# Patient Record
Sex: Female | Born: 1965 | Race: White | Hispanic: No | Marital: Married | Smoking: Never smoker
Health system: Southern US, Community
[De-identification: ages and names within clinical notes are randomized; demographics above are authoritative.]

## PROBLEM LIST (undated history)

## (undated) HISTORY — PX: TUBAL LIGATION: SHX77

## (undated) HISTORY — PX: TONSILLECTOMY: SUR1361

---

## 2005-01-20 ENCOUNTER — Emergency Department: Payer: Self-pay | Admitting: Unknown Physician Specialty

## 2005-01-20 ENCOUNTER — Inpatient Hospital Stay: Payer: Self-pay | Admitting: Internal Medicine

## 2005-07-29 ENCOUNTER — Ambulatory Visit: Payer: Self-pay | Admitting: Obstetrics and Gynecology

## 2005-10-05 ENCOUNTER — Ambulatory Visit: Payer: Self-pay

## 2007-10-09 ENCOUNTER — Ambulatory Visit: Payer: Self-pay

## 2009-12-30 ENCOUNTER — Ambulatory Visit: Payer: Self-pay | Admitting: Family Medicine

## 2011-06-14 HISTORY — PX: TOTAL ABDOMINAL HYSTERECTOMY: SHX209

## 2011-06-14 HISTORY — PX: ABDOMINAL HYSTERECTOMY: SHX81

## 2011-08-09 LAB — LIPID PANEL
Cholesterol: 124 mg/dL (ref 0–200)
HDL: 60 mg/dL (ref 35–70)
LDL CALC: 57 mg/dL
Triglycerides: 36 mg/dL — AB (ref 40–160)

## 2011-08-10 ENCOUNTER — Ambulatory Visit: Payer: Self-pay | Admitting: Internal Medicine

## 2011-08-12 ENCOUNTER — Ambulatory Visit: Payer: Self-pay | Admitting: Internal Medicine

## 2011-12-20 ENCOUNTER — Ambulatory Visit: Payer: Self-pay | Admitting: Obstetrics and Gynecology

## 2011-12-20 LAB — BASIC METABOLIC PANEL
BUN: 18 mg/dL (ref 7–18)
EGFR (Non-African Amer.): >60
Glucose: 87 mg/dL (ref 65–99)
Osmolality: 281 (ref 275–301)
Potassium: 3.5 mmol/L (ref 3.5–5.1)
Sodium: 140 mmol/L (ref 136–145)

## 2011-12-26 ENCOUNTER — Ambulatory Visit: Payer: Self-pay | Admitting: Obstetrics and Gynecology

## 2011-12-27 LAB — HEMATOCRIT: HCT: 33.7 % — ABNORMAL LOW (ref 35.0–47.0)

## 2011-12-27 LAB — BASIC METABOLIC PANEL
Anion Gap: 7 (ref 7–16)
BUN: 10 mg/dL (ref 7–18)
Chloride: 103 mmol/L (ref 98–107)
Creatinine: 0.58 mg/dL — ABNORMAL LOW (ref 0.60–1.30)
EGFR (Non-African Amer.): >60
Glucose: 103 mg/dL — ABNORMAL HIGH (ref 65–99)
Potassium: 3.8 mmol/L (ref 3.5–5.1)
Sodium: 138 mmol/L (ref 136–145)

## 2011-12-27 LAB — PATHOLOGY REPORT

## 2012-02-09 ENCOUNTER — Ambulatory Visit: Payer: Self-pay | Admitting: Internal Medicine

## 2013-02-18 LAB — TSH: TSH: 1.1 u[IU]/mL (ref <=5.90)

## 2013-02-18 LAB — BASIC METABOLIC PANEL
BUN: 20 mg/dL (ref 4–21)
Creatinine: 0.6 mg/dL (ref <=1.1)

## 2013-02-18 LAB — CBC AND DIFFERENTIAL
Hemoglobin: 12.5 g/dL (ref 12.0–16.0)
WBC: 4.9 10*3/mL

## 2013-02-21 ENCOUNTER — Ambulatory Visit: Payer: Self-pay | Admitting: Internal Medicine

## 2013-02-21 LAB — HM MAMMOGRAPHY: HM Mammogram: NORMAL

## 2014-09-30 NOTE — Discharge Summary (Signed)
PATIENT NAMEJAILYNE, [name] MR#:  [id number] DATE OF BIRTH:  21-Feb-1966  DATE OF ADMISSION:  12/26/2011 DATE OF DISCHARGE:  12/27/2011  PRINCIPLE PROCEDURE:  1. Total vaginal hysterectomy. 2. Anterior colporrhaphy.   HOSPITAL COURSE: The patient underwent the above procedures which were uncomplicated. The patient on postoperative day number one was tolerating clear liquids without difficulty and urinating after the Foley catheter was removed. On postoperative day number hematocrit 33.7% and BUN and creatinine 10 and 0.58. Exam is benign. Minimal spotting. The patient is discharged to home in good condition.   DISCHARGE MEDICATIONS:  1. Norco 1 to 2 tablets every 4 to 6 hours as needed.  2. Naprosyn 500 mg 1 tablet every 12 hours as needed.   DISCHARGE INSTRUCTIONS: The patient will follow-up with Dr. Ouida Sills in two weeks for wound care. Pelvic rest is to be followed for the next four weeks. The patient will follow up before two weeks if she has increasing nausea or vomiting, increasing heavy vaginal bleeding, or fever.  ____________________________ Boykin Nearing, MD tjs:slb D: 12/27/2011 08:38:06 ET T: 12/27/2011 15:01:54 ET JOB#: [id number]  cc: Boykin Nearing, MD, <Dictator> Boykin Nearing MD ELECTRONICALLY SIGNED 01/02/2012 9:17

## 2014-10-05 NOTE — Op Note (Signed)
PATIENT NAMEFRANCENA, [name] MR#:  [id number] DATE OF BIRTH:  Jun 30, 1965  DATE OF PROCEDURE:  12/26/2011  PREOPERATIVE DIAGNOSES:  1. Third degree uterine descensus. 2. Second degree cystocele.   POSTOPERATIVE DIAGNOSES: 1. Third degree uterine descensus. 2. Second degree cystocele.   PROCEDURES:  1. Total vaginal hysterectomy.  2. Anterior colporrhaphy.   ANESTHESIA: General endotracheal.   SURGEON: Boykin Nearing, MD   FIRST ASSISTANT: Franchot Erichsen, MD   INDICATIONS: This is a 49 year old gravida 3 para 3 patient with bulging tissues at the vagina. The patient is noted to have a third degree uterine descensus and a second degree cystocele.   PROCEDURE: After adequate general endotracheal anesthesia, the patient was placed in the dorsal supine position with the legs in the candy-cane stirrups. The patient received 2 grams IV cefoxitin prior to commencement of the case. Lower abdomen, perineal, and vagina were prepped and draped in a normal sterile fashion. The patient's bladder was catheterized yielding 600 mL of urine. Protruding cervix noted at the introitus. A weighted speculum was placed in the posterior vaginal vault and the anterior and posterior cervix were grasped with thyroid tenacula and the cervix was circumferentially injected with 1% lidocaine with 1:100,000 epinephrine. Direct posterior colpotomy incision was made. Upon entry into the posterior cul-de-sac, the uterosacral ligaments were bilaterally clamped, transected, suture ligated, and tagged for later identification. The anterior cervix was then circumferentially incised with the Bovie cautery and anterior cul-de-sac entered without difficulty. There was noted to have some scar tissue at the fundal area. The Deaver retractor was placed within this blind cul-de-sac to elevate the bladder while dissection ensued. The uterine arteries were bilaterally clamped, transected, and suture ligated with 0 Vicryl suture. Serial  clamps in the broad ligament performed, transected, and suture ligated with 0 Vicryl suture. The uterus was then flipped posteriorly and the cornua were then identified and clamped ensuring not to incorporate the bladder. The uterus was then removed and the corneal pedicles were doubly ligated with 0 Vicryl suture. Good hemostasis noted. Ovaries appeared normal. The peritoneum was then closed with a purse-string 2-0 PDS suture with good closure. Attention was then directed to the patient's anterior vagina. The anterior vagina was grasped with two Allis clamps and the epithelium was injected with 1% lidocaine with 1:100,000 epinephrine. The anterior vagina was then opened centrally to approximately 2 cm from the urethral orifice. The vaginal epithelium was then opened in its full length and the subfascial tissues dissected off the epithelium bilaterally. Good hemostasis was noted. The reduction of the tissue was accomplished with sharp dissection and gentle traction with a gauze draped finger during the dissection. Healthy pelvic fascia was then grasped laterally and plicated centrally. Several 2-0 Vicryl sutures were used to repair the defect. Good cosmetic effect resulted. The vaginal epithelium was then trimmed bilaterally and the total length of the vaginal epithelium was then closed with 0 Vicryl suture. The uterosacral ligaments were approximated centrally and the rest of the vaginal vault closed with 0 Vicryl suture. Minimal blood loss. Good hemostasis noted. Foley catheter was placed at the end of the case. Clear urine resulted. The patient tolerated the procedure well.   Estimated blood loss 50 mL. Urine output 700 mL. Intraoperative fluids 1000 mL. The patient was taken to the recovery room in good condition.    ____________________________ Boykin Nearing, MD tjs:drc D: 12/26/2011 11:10:45 ET T: 12/26/2011 11:25:29 ET JOB#: [id number]  cc: Boykin Nearing, MD, <Dictator> Gwen Her  Annahi Short MD ELECTRONICALLY SIGNED 12/27/2011 8:56

## 2015-03-25 ENCOUNTER — Encounter: Payer: Self-pay | Admitting: Internal Medicine

## 2015-03-29 ENCOUNTER — Encounter: Payer: Self-pay | Admitting: Internal Medicine

## 2015-03-29 DIAGNOSIS — Z87898 Personal history of other specified conditions: Secondary | ICD-10-CM | POA: Insufficient documentation

## 2015-05-19 ENCOUNTER — Ambulatory Visit (INDEPENDENT_AMBULATORY_CARE_PROVIDER_SITE_OTHER): Payer: BLUE CROSS/BLUE SHIELD | Admitting: Internal Medicine

## 2015-05-19 ENCOUNTER — Encounter: Payer: Self-pay | Admitting: Internal Medicine

## 2015-05-19 VITALS — BP 138/82 | HR 68 | Ht 63.0 in | Wt 182.8 lb

## 2015-05-19 DIAGNOSIS — Z0000 Encounter for general adult medical examination without abnormal findings: Secondary | ICD-10-CM

## 2015-05-19 DIAGNOSIS — Z1239 Encounter for other screening for malignant neoplasm of breast: Secondary | ICD-10-CM

## 2015-05-19 DIAGNOSIS — H0014 Chalazion left upper eyelid: Secondary | ICD-10-CM | POA: Diagnosis not present

## 2015-05-19 LAB — POCT URINALYSIS DIPSTICK
Bilirubin, UA: NEGATIVE
Blood, UA: NEGATIVE
Glucose, UA: NEGATIVE
KETONES UA: NEGATIVE
LEUKOCYTES UA: NEGATIVE
Nitrite, UA: NEGATIVE
PROTEIN UA: NEGATIVE
Spec Grav, UA: 1.01
UROBILINOGEN UA: 0.2
pH, UA: 5

## 2015-05-19 MED ORDER — ERYTHROMYCIN 5 MG/GM OP OINT: 1.0000 "application " | TOPICAL_OINTMENT | Freq: Every day | OPHTHALMIC | Status: DC

## 2015-05-19 NOTE — Patient Instructions (Signed)
Breast Self-Awareness Practicing breast self-awareness may pick up problems early, prevent significant medical complications, and possibly save your life. By practicing breast self-awareness, you can become familiar with how your breasts look and feel and if your breasts are changing. This allows you to notice changes early. It can also offer you some reassurance that your breast health is good. One way to learn what is normal for your breasts and whether your breasts are changing is to do a breast self-exam. If you find a lump or something that was not present in the past, it is best to contact your caregiver right away. Other findings that should be evaluated by your caregiver include nipple discharge, especially if it is bloody; skin changes or reddening; areas where the skin seems to be pulled in (retracted); or new lumps and bumps. Breast pain is seldom associated with cancer (malignancy), but should also be evaluated by a caregiver. HOW TO PERFORM A BREAST SELF-EXAM The best time to examine your breasts is 5-7 days after your menstrual period is over. During menstruation, the breasts are lumpier, and it may be more difficult to pick up changes. If you do not menstruate, have reached menopause, or had your uterus removed (hysterectomy), you should examine your breasts at regular intervals, such as monthly. If you are breastfeeding, examine your breasts after a feeding or after using a breast pump. Breast implants do not decrease the risk for lumps or tumors, so continue to perform breast self-exams as recommended. Talk to your caregiver about how to determine the difference between the implant and breast tissue. Also, talk about the amount of pressure you should use during the exam. Over time, you will become more familiar with the variations of your breasts and more comfortable with the exam. A breast self-exam requires you to remove all your clothes above the waist. 1. Look at your breasts and nipples.  Stand in front of a mirror in a room with good lighting. With your hands on your hips, push your hands firmly downward. Look for a difference in shape, contour, and size from one breast to the other (asymmetry). Asymmetry includes puckers, dips, or bumps. Also, look for skin changes, such as reddened or scaly areas on the breasts. Look for nipple changes, such as discharge, dimpling, repositioning, or redness. 2. Carefully feel your breasts. This is best done either in the shower or tub while using soapy water or when flat on your back. Place the arm (on the side of the breast you are examining) above your head. Use the pads (not the fingertips) of your three middle fingers on your opposite hand to feel your breasts. Start in the underarm area and use  inch (2 cm) overlapping circles to feel your breast. Use 3 different levels of pressure (light, medium, and firm pressure) at each circle before moving to the next circle. The light pressure is needed to feel the tissue closest to the skin. The medium pressure will help to feel breast tissue a little deeper, while the firm pressure is needed to feel the tissue close to the ribs. Continue the overlapping circles, moving downward over the breast until you feel your ribs below your breast. Then, move one finger-width towards the center of the body. Continue to use the  inch (2 cm) overlapping circles to feel your breast as you move slowly up toward the collar bone (clavicle) near the base of the neck. Continue the up and down exam using all 3 pressures until you reach the   middle of the chest. Do this with each breast, carefully feeling for lumps or changes. 3.  Keep a written record with breast changes or normal findings for each breast. By writing this information down, you do not need to depend only on memory for size, tenderness, or location. Write down where you are in your menstrual cycle, if you are still menstruating. Breast tissue can have some lumps or  thick tissue. However, see your caregiver if you find anything that concerns you.  SEEK MEDICAL CARE IF:  You see a change in shape, contour, or size of your breasts or nipples.   You see skin changes, such as reddened or scaly areas on the breasts or nipples.   You have an unusual discharge from your nipples.   You feel a new lump or unusually thick areas.    This information is not intended to replace advice given to you by your health care provider. Make sure you discuss any questions you have with your health care provider.   Document Released: 05/30/2005 Document Revised: 05/16/2012 Document Reviewed: 09/14/2011 Elsevier Interactive Patient Education 2016 [employer]  

## 2015-05-19 NOTE — Progress Notes (Signed)
Date:  05/19/2015   Name:  [name]   DOB:  [birth year]   MRN:  [id number]   Chief Complaint: Annual Exam [name] is a 49 y.o. female who presents today for her Complete Annual Exam. She feels well. She reports exercising intermittently. She reports she is sleeping well.She denies breast problems.  She is due for a mammogram.    Review of Systems  Constitutional: Negative for fever, chills, diaphoresis and fatigue.  HENT: Negative for hearing loss, tinnitus, trouble swallowing and voice change.   Eyes: Positive for itching (left eye lid - swollen yesterday). Negative for visual disturbance.  Respiratory: Negative for chest tightness, shortness of breath and wheezing.   Cardiovascular: Negative for chest pain, palpitations and leg swelling.  Gastrointestinal: Negative for abdominal pain, diarrhea, constipation and blood in stool.  Endocrine: Negative for polydipsia and polyuria.  Genitourinary: Negative for dysuria, urgency, hematuria and pelvic pain.  Musculoskeletal: Negative for myalgias, back pain and arthralgias.  Skin: Negative for color change and rash.  Allergic/Immunologic: Negative for environmental allergies.  Neurological: Negative for dizziness, tremors, light-headedness and headaches.  Hematological: Does not bruise/bleed easily.  Psychiatric/Behavioral: Negative for sleep disturbance and dysphoric mood.    Patient Active Problem List   Diagnosis Date Noted  . H/O headache 03/29/2015    Prior to Admission medications   Not on File    Allergies  Allergen Reactions  . Penicillins     Past Surgical History  Procedure Laterality Date  . Abdominal hysterectomy  2013    PARTIAL - PROLAPSE  . Tubal ligation    . Tonsillectomy      Social History  Substance Use Topics  . Smoking status: Never Smoker   . Smokeless tobacco: None  . Alcohol Use: No    Medication list has been reviewed and updated.   Physical Exam  Constitutional: She is oriented to  person, place, and time. She appears well-developed and well-nourished. No distress.  HENT:  Head: Normocephalic and atraumatic.  Right Ear: Tympanic membrane and ear canal normal.  Left Ear: Tympanic membrane and ear canal normal.  Nose: Right sinus exhibits no maxillary sinus tenderness. Left sinus exhibits no maxillary sinus tenderness.  Mouth/Throat: Uvula is midline and oropharynx is clear and moist.  Eyes: Conjunctivae and EOM are normal. Right eye exhibits no discharge. Left eye exhibits hordeolum. Left eye exhibits no discharge. No scleral icterus.  Neck: Normal range of motion. Carotid bruit is not present. No erythema present. No thyromegaly present.  Cardiovascular: Normal rate, regular rhythm, normal heart sounds and normal pulses.   Pulmonary/Chest: Effort normal. No respiratory distress. She has no wheezes. Right breast exhibits no mass, no nipple discharge, no skin change and no tenderness. Left breast exhibits no mass, no nipple discharge, no skin change and no tenderness.  Abdominal: Soft. Bowel sounds are normal. There is no hepatosplenomegaly. There is no tenderness. There is no CVA tenderness.  Musculoskeletal: Normal range of motion.  Lymphadenopathy:    She has no cervical adenopathy.    She has no axillary adenopathy.  Neurological: She is alert and oriented to person, place, and time. She has normal reflexes. No cranial nerve deficit or sensory deficit.  Skin: Skin is warm, dry and intact. No rash noted.  Psychiatric: She has a normal mood and affect. Her speech is normal and behavior is normal. Thought content normal.  Nursing note and vitals reviewed.   BP 138/82 mmHg  Pulse 68  Ht 5\' 3"  (1.6 m)  Wt 182 lb 12.8 oz (82.918 kg)  BMI 32.39 kg/m2  Assessment and Plan: 1. Annual physical exam Normal exam; vaccines are up-to-date Tetanus done in June 2011 Patient encouraged to begin regular exercise for weight control - POCT urinalysis dipstick - CBC with  Differential/Platelet - Comprehensive metabolic panel - TSH - Lipid panel  2. Chalazion of left upper eyelid Warm compresses; ophthalmology referral if persistent - erythromycin ophthalmic ointment; Place 1 application into the left eye at bedtime.  Dispense: 3.5 g; Refill: 0  3. Breast cancer screening Patient encouraged to perform regular breast self exams - MM DIGITAL SCREENING BILATERAL; Future   Halina Maidens, MD [facility]  05/19/2015

## 2015-05-20 ENCOUNTER — Encounter: Payer: Self-pay | Admitting: Internal Medicine

## 2015-05-20 LAB — CBC WITH DIFFERENTIAL/PLATELET
BASOS: 1 %
Basophils Absolute: 0 10*3/uL (ref 0.0–0.2)
EOS (ABSOLUTE): 0.2 10*3/uL (ref 0.0–0.4)
EOS: 4 %
HEMATOCRIT: 37.3 % (ref 34.0–46.6)
Hemoglobin: 12.1 g/dL (ref 11.1–15.9)
IMMATURE GRANS (ABS): 0 10*3/uL (ref 0.0–0.1)
IMMATURE GRANULOCYTES: 0 %
LYMPHS: 26 %
Lymphocytes Absolute: 1.3 10*3/uL (ref 0.7–3.1)
MCH: 29.7 pg (ref 26.6–33.0)
MCHC: 32.4 g/dL (ref 31.5–35.7)
MCV: 91 fL (ref 79–97)
Monocytes Absolute: 0.5 10*3/uL (ref 0.1–0.9)
Monocytes: 9 %
NEUTROS PCT: 60 %
Neutrophils Absolute: 3 10*3/uL (ref 1.4–7.0)
PLATELETS: 220 10*3/uL (ref 150–379)
RBC: 4.08 x10E6/uL (ref 3.77–5.28)
RDW: 12.8 % (ref 12.3–15.4)
WBC: 5 10*3/uL (ref 3.4–10.8)

## 2015-05-20 LAB — LIPID PANEL
CHOL/HDL RATIO: 2.6 ratio (ref 0.0–4.4)
CHOLESTEROL TOTAL: 148 mg/dL (ref 100–199)
HDL: 58 mg/dL (ref >39)
LDL CALC: 81 mg/dL (ref 0–99)
TRIGLYCERIDES: 45 mg/dL (ref 0–149)
VLDL CHOLESTEROL CAL: 9 mg/dL (ref 5–40)

## 2015-05-20 LAB — COMPREHENSIVE METABOLIC PANEL
A/G RATIO: 1.6 (ref 1.1–2.5)
ALT: 15 IU/L (ref 0–32)
AST: 16 IU/L (ref 0–40)
Albumin: 4.1 g/dL (ref 3.5–5.5)
Alkaline Phosphatase: 56 IU/L (ref 39–117)
BUN/Creatinine Ratio: 32 — ABNORMAL HIGH (ref 9–23)
BUN: 19 mg/dL (ref 6–24)
Bilirubin Total: 0.4 mg/dL (ref 0.0–1.2)
CALCIUM: 9.1 mg/dL (ref 8.7–10.2)
CO2: 26 mmol/L (ref 18–29)
CREATININE: 0.59 mg/dL (ref 0.57–1.00)
Chloride: 104 mmol/L (ref 97–106)
GFR, EST AFRICAN AMERICAN: 125 mL/min/{1.73_m2} (ref >59)
GFR, EST NON AFRICAN AMERICAN: 109 mL/min/{1.73_m2} (ref >59)
GLOBULIN, TOTAL: 2.5 g/dL (ref 1.5–4.5)
Glucose: 87 mg/dL (ref 65–99)
POTASSIUM: 4.1 mmol/L (ref 3.5–5.2)
Sodium: 143 mmol/L (ref 136–144)
TOTAL PROTEIN: 6.6 g/dL (ref 6.0–8.5)

## 2015-05-20 LAB — TSH: TSH: 0.747 u[IU]/mL (ref 0.450–4.500)

## 2015-06-03 ENCOUNTER — Ambulatory Visit: Payer: Self-pay

## 2015-06-24 ENCOUNTER — Ambulatory Visit
Admission: RE | Admit: 2015-06-24 | Discharge: 2015-06-24 | Disposition: A | Discharge status: Home / Self Care | Payer: BLUE CROSS/BLUE SHIELD | Source: Ambulatory Visit | Attending: Internal Medicine | Admitting: Internal Medicine

## 2015-06-24 DIAGNOSIS — Z1239 Encounter for other screening for malignant neoplasm of breast: Secondary | ICD-10-CM

## 2015-06-24 DIAGNOSIS — Z1231 Encounter for screening mammogram for malignant neoplasm of breast: Secondary | ICD-10-CM | POA: Insufficient documentation

## 2016-05-19 ENCOUNTER — Encounter: Payer: Self-pay | Admitting: Internal Medicine

## 2016-05-19 ENCOUNTER — Ambulatory Visit (INDEPENDENT_AMBULATORY_CARE_PROVIDER_SITE_OTHER): Payer: BLUE CROSS/BLUE SHIELD | Admitting: Internal Medicine

## 2016-05-19 VITALS — BP 138/82 | HR 82 | Resp 16 | Ht 63.0 in | Wt 185.2 lb

## 2016-05-19 DIAGNOSIS — Z0000 Encounter for general adult medical examination without abnormal findings: Secondary | ICD-10-CM | POA: Diagnosis not present

## 2016-05-19 DIAGNOSIS — Z1231 Encounter for screening mammogram for malignant neoplasm of breast: Secondary | ICD-10-CM

## 2016-05-19 DIAGNOSIS — Z1239 Encounter for other screening for malignant neoplasm of breast: Secondary | ICD-10-CM

## 2016-05-19 LAB — POCT URINALYSIS DIPSTICK
BILIRUBIN UA: NEGATIVE
Blood, UA: NEGATIVE
Glucose, UA: NEGATIVE
Ketones, UA: NEGATIVE
Leukocytes, UA: NEGATIVE
Nitrite, UA: NEGATIVE
PROTEIN UA: NEGATIVE
SPEC GRAV UA: 1.01
Urobilinogen, UA: 0.2
pH, UA: 6.5

## 2016-05-19 NOTE — Progress Notes (Signed)
Date:  05/19/2016   Name:  [name]   DOB:  [birth year]   MRN:  [id number]   Chief Complaint: Annual Exam [name] is a 50 y.o. female who presents today for her Complete Annual Exam. She feels well. She reports exercising rarely. 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 She reports she is sleeping well. No breast problems.  Due for mammogram in January.    Review of Systems  Constitutional: Negative for chills, fatigue and fever.  HENT: Negative for congestion, hearing loss, tinnitus, trouble swallowing and voice change.   Eyes: Negative for visual disturbance.  Respiratory: Negative for cough, chest tightness, shortness of breath and wheezing.   Cardiovascular: Negative for chest pain, palpitations and leg swelling.  Gastrointestinal: Negative for abdominal pain, constipation, diarrhea and vomiting.  Endocrine: Negative for polydipsia and polyuria.  Genitourinary: Negative for dysuria, frequency, genital sores, vaginal bleeding and vaginal discharge.  Musculoskeletal: Positive for arthralgias (plantar fasciits on left). Negative for gait problem and joint swelling.  Skin: Negative for color change and rash.  Neurological: Negative for dizziness, tremors, light-headedness and headaches.  Hematological: Negative for adenopathy. Does not bruise/bleed easily.  Psychiatric/Behavioral: Negative for dysphoric mood and sleep disturbance. The patient is not nervous/anxious.     Patient Active Problem List   Diagnosis Date Noted  . H/O headache 03/29/2015    Prior to Admission medications   Not on File    Allergies  Allergen Reactions  . Penicillins     Past Surgical History:  Procedure Laterality Date  . ABDOMINAL HYSTERECTOMY  2013   PARTIAL - PROLAPSE  . TONSILLECTOMY    . TUBAL LIGATION      Social History  Substance Use Topics  . Smoking status: Never Smoker  . Smokeless tobacco: Never Used  . Alcohol use No     Medication list has been reviewed and  updated.   Physical Exam  Constitutional: She is oriented to person, place, and time. She appears well-developed and well-nourished. No distress.  HENT:  Head: Normocephalic and atraumatic.  Right Ear: Tympanic membrane and ear canal normal.  Left Ear: Tympanic membrane and ear canal normal.  Nose: Right sinus exhibits no maxillary sinus tenderness. Left sinus exhibits no maxillary sinus tenderness.  Mouth/Throat: Uvula is midline and oropharynx is clear and moist.  Eyes: Conjunctivae and EOM are normal. Right eye exhibits no discharge. Left eye exhibits no discharge. No scleral icterus.  Neck: Normal range of motion. Carotid bruit is not present. No erythema present. No thyromegaly present.  Cardiovascular: Normal rate, regular rhythm, normal heart sounds and normal pulses.   Pulmonary/Chest: Effort normal. No respiratory distress. She has no wheezes. Right breast exhibits no mass, no nipple discharge, no skin change and no tenderness. Left breast exhibits no mass, no nipple discharge, no skin change and no tenderness.  Abdominal: Soft. Bowel sounds are normal. There is no hepatosplenomegaly. There is no tenderness. There is no CVA tenderness.  Musculoskeletal: Normal range of motion.       Feet:  Lymphadenopathy:    She has no cervical adenopathy.    She has no axillary adenopathy.  Neurological: She is alert and oriented to person, place, and time. She has normal reflexes. No cranial nerve deficit or sensory deficit.  Skin: Skin is warm, dry and intact. No rash noted.  Psychiatric: She has a normal mood and affect. Her speech is normal and behavior is normal. Thought content normal.  Nursing note and vitals  reviewed.   BP 138/82   Pulse 82   Resp 16   Ht 5\' 3"  (1.6 m)   Wt 185 lb 3.2 oz (84 kg)   SpO2 100%   BMI 32.81 kg/m   Assessment and Plan: 1. Annual physical exam Recommend calcium + D daily - Comprehensive metabolic panel - CBC with Differential/Platelet - TSH -  POCT urinalysis dipstick  2. Breast cancer screening - MM DIGITAL SCREENING BILATERAL; Future   Halina Maidens, MD [facility]  05/19/2016

## 2016-05-19 NOTE — Patient Instructions (Signed)
Calcium 1200 mg + Vitamin D 400 IU daily   Breast Self-Awareness Introduction Breast self-awareness means being familiar with how your breasts look and feel. It involves checking your breasts regularly and reporting any changes to your health care provider. Practicing breast self-awareness is important. A change in your breasts can be a sign of a serious medical problem. Being familiar with how your breasts look and feel allows you to find any problems early, when treatment is more likely to be successful. All women should practice breast self-awareness, including women who have had breast implants. How to do a breast self-exam One way to learn what is normal for your breasts and whether your breasts are changing is to do a breast self-exam. To do a breast self-exam: Look for Changes  1. Remove all the clothing above your waist. 2. Stand in front of a mirror in a room with good lighting. 3. Put your hands on your hips. 4. Push your hands firmly downward. 5. Compare your breasts in the mirror. Look for differences between them (asymmetry), such as:  Differences in shape.  Differences in size.  Puckers, dips, and bumps in one breast and not the other. 6. Look at each breast for changes in your skin, such as:  Redness.  Scaly areas. 7. Look for changes in your nipples, such as:  Discharge.  Bleeding.  Dimpling.  Redness.  A change in position. Feel for Changes  Carefully feel your breasts for lumps and changes. It is best to do this while lying on your back on the floor and again while sitting or standing in the shower or tub with soapy water on your skin. Feel each breast in the following way:  Place the arm on the side of the breast you are examining above your head.  Feel your breast with the other hand.  Start in the nipple area and make  inch (2 cm) overlapping circles to feel your breast. Use the pads of your three middle fingers to do this. Apply light pressure, then  medium pressure, then firm pressure. The light pressure will allow you to feel the tissue closest to the skin. The medium pressure will allow you to feel the tissue that is a little deeper. The firm pressure will allow you to feel the tissue close to the ribs.  Continue the overlapping circles, moving downward over the breast until you feel your ribs below your breast.  Move one finger-width toward the center of the body. Continue to use the  inch (2 cm) overlapping circles to feel your breast as you move slowly up toward your collarbone.  Continue the up and down exam using all three pressures until you reach your armpit. Write Down What You Find  Write down what is normal for each breast and any changes that you find. Keep a written record with breast changes or normal findings for each breast. By writing this information down, you do not need to depend only on memory for size, tenderness, or location. Write down where you are in your menstrual cycle, if you are still menstruating. If you are having trouble noticing differences in your breasts, do not get discouraged. With time you will become more familiar with the variations in your breasts and more comfortable with the exam. How often should I examine my breasts? Examine your breasts every month. If you are breastfeeding, the best time to examine your breasts is after a feeding or after using a breast pump. If you menstruate, the  best time to examine your breasts is 5-7 days after your period is over. During your period, your breasts are lumpier, and it may be more difficult to notice changes. When should I see my health care provider? See your health care provider if you notice:  A change in shape or size of your breasts or nipples.  A change in the skin of your breast or nipples, such as a reddened or scaly area.  Unusual discharge from your nipples.  A lump or thick area that was not there before.  Pain in your breasts.  Anything  that concerns you. This information is not intended to replace advice given to you by your health care provider. Make sure you discuss any questions you have with your health care provider. Document Released: 05/30/2005 Document Revised: 11/05/2015 Document Reviewed: 04/19/2015  2017 Elsevier

## 2016-05-20 LAB — CBC WITH DIFFERENTIAL/PLATELET
BASOS ABS: 0 10*3/uL (ref 0.0–0.2)
BASOS: 1 %
EOS (ABSOLUTE): 0.2 10*3/uL (ref 0.0–0.4)
Eos: 3 %
Hematocrit: 36.3 % (ref 34.0–46.6)
Hemoglobin: 12.1 g/dL (ref 11.1–15.9)
IMMATURE GRANS (ABS): 0 10*3/uL (ref 0.0–0.1)
Immature Granulocytes: 0 %
LYMPHS: 30 %
Lymphocytes Absolute: 1.3 10*3/uL (ref 0.7–3.1)
MCH: 30.2 pg (ref 26.6–33.0)
MCHC: 33.3 g/dL (ref 31.5–35.7)
MCV: 91 fL (ref 79–97)
MONOS ABS: 0.4 10*3/uL (ref 0.1–0.9)
Monocytes: 9 %
NEUTROS ABS: 2.5 10*3/uL (ref 1.4–7.0)
NEUTROS PCT: 57 %
Platelets: 229 10*3/uL (ref 150–379)
RBC: 4.01 x10E6/uL (ref 3.77–5.28)
RDW: 12.7 % (ref 12.3–15.4)
WBC: 4.4 10*3/uL (ref 3.4–10.8)

## 2016-05-20 LAB — COMPREHENSIVE METABOLIC PANEL
A/G RATIO: 1.9 (ref 1.2–2.2)
ALT: 15 IU/L (ref 0–32)
AST: 16 IU/L (ref 0–40)
Albumin: 4.3 g/dL (ref 3.5–5.5)
Alkaline Phosphatase: 57 IU/L (ref 39–117)
BILIRUBIN TOTAL: 0.4 mg/dL (ref 0.0–1.2)
BUN/Creatinine Ratio: 25 — ABNORMAL HIGH (ref 9–23)
BUN: 17 mg/dL (ref 6–24)
CALCIUM: 8.9 mg/dL (ref 8.7–10.2)
CHLORIDE: 102 mmol/L (ref 96–106)
CO2: 26 mmol/L (ref 18–29)
Creatinine, Ser: 0.69 mg/dL (ref 0.57–1.00)
GFR, EST AFRICAN AMERICAN: 118 mL/min/{1.73_m2} (ref >59)
GFR, EST NON AFRICAN AMERICAN: 103 mL/min/{1.73_m2} (ref >59)
GLOBULIN, TOTAL: 2.3 g/dL (ref 1.5–4.5)
Glucose: 89 mg/dL (ref 65–99)
POTASSIUM: 4.1 mmol/L (ref 3.5–5.2)
SODIUM: 142 mmol/L (ref 134–144)
Total Protein: 6.6 g/dL (ref 6.0–8.5)

## 2016-05-20 LAB — TSH: TSH: 1.03 u[IU]/mL (ref 0.450–4.500)

## 2016-07-26 ENCOUNTER — Ambulatory Visit
Admission: RE | Admit: 2016-07-26 | Discharge: 2016-07-26 | Disposition: A | Discharge status: Home / Self Care | Payer: BLUE CROSS/BLUE SHIELD | Source: Ambulatory Visit | Attending: Internal Medicine | Admitting: Internal Medicine

## 2016-07-26 DIAGNOSIS — Z1239 Encounter for other screening for malignant neoplasm of breast: Secondary | ICD-10-CM

## 2016-07-26 DIAGNOSIS — Z1231 Encounter for screening mammogram for malignant neoplasm of breast: Secondary | ICD-10-CM | POA: Diagnosis not present

## 2017-04-28 ENCOUNTER — Ambulatory Visit (INDEPENDENT_AMBULATORY_CARE_PROVIDER_SITE_OTHER): Payer: 59 | Admitting: Internal Medicine

## 2017-04-28 ENCOUNTER — Encounter: Payer: Self-pay | Admitting: Internal Medicine

## 2017-04-28 VITALS — BP 134/80 | HR 69 | Ht 63.0 in | Wt 184.8 lb

## 2017-04-28 DIAGNOSIS — M7062 Trochanteric bursitis, left hip: Secondary | ICD-10-CM

## 2017-04-28 DIAGNOSIS — Z1211 Encounter for screening for malignant neoplasm of colon: Secondary | ICD-10-CM | POA: Diagnosis not present

## 2017-04-28 DIAGNOSIS — Z0000 Encounter for general adult medical examination without abnormal findings: Secondary | ICD-10-CM | POA: Diagnosis not present

## 2017-04-28 DIAGNOSIS — Z1231 Encounter for screening mammogram for malignant neoplasm of breast: Secondary | ICD-10-CM

## 2017-04-28 DIAGNOSIS — Z1239 Encounter for other screening for malignant neoplasm of breast: Secondary | ICD-10-CM

## 2017-04-28 NOTE — Patient Instructions (Addendum)
Hip Bursitis Hip bursitis is inflammation of a fluid-filled sac (bursa) in the hip joint. The bursa protects the bones in the hip joint from rubbing against each other. Hip bursitis can cause mild to moderate pain, and symptoms often come and go over time. What are the causes? This condition may be caused by:  Injury to the hip.  Overuse of the muscles that surround the hip joint.  Arthritis or gout.  Diabetes.  Thyroid disease.  Cold weather.  Infection.  In some cases, the cause may not be known. What are the signs or symptoms? Symptoms of this condition may include:  Mild or moderate pain in the hip area. Pain may get worse with movement.  Tenderness and swelling of the hip, especially on the outer side of the hip.  Symptoms may come and go. If the bursa becomes infected, you may have the following symptoms:  Fever.  Red skin and a feeling of warmth in the hip area.  How is this diagnosed? This condition may be diagnosed based on:  A physical exam.  Your medical history.  X-rays.  Removal of fluid from your inflamed bursa for testing (biopsy).  You may be sent to a health care provider who specializes in bone diseases (orthopedist) or a provider who specializes in joint inflammation (rheumatologist). How is this treated? This condition is treated by resting, raising (elevating), and applying pressure(compression) to the injured area. In some cases, this may be enough to make your symptoms go away. Treatment may also include:  Crutches.  Antibiotic medicine.  Draining fluid out of the bursa to help relieve swelling.  Injecting medicine that helps to reduce inflammation (cortisone).  Follow these instructions at home: Medicines  Take over-the-counter and prescription medicines only as told by your health care provider.  Do not drive or operate heavy machinery while taking prescription pain medicine, or as told by your health care provider.  If you were  prescribed an antibiotic, take it as told by your health care provider. Do not stop taking the antibiotic even if you start to feel better. Activity  Return to your normal activities as told by your health care provider. Ask your health care provider what activities are safe for you.  Rest and protect your hip as much as possible until your pain and swelling get better. General instructions  Wear compression wraps only as told by your health care provider.  Elevate your hip above the level of your heart as much as you can without pain. To do this, try putting a pillow under your hips while you lie down.  Do not use your hip to support your body weight until your health care provider says that you can. Use crutches as told by your health care provider.  Gently massage and stretch your injured area as often as is comfortable.  Keep all follow-up visits as told by your health care provider. This is important. How is this prevented?  Exercise regularly, as told by your health care provider.  Warm up and stretch before being active.  Cool down and stretch after being active.  If an activity irritates your hip or causes pain, avoid the activity as much as possible.  Avoid sitting down for long periods at a time. Contact a health care provider if:  You have a fever.  You develop new symptoms.  You have difficulty walking or doing everyday activities.  You have pain that gets worse or does not get better with medicine.  You  develop red skin or a feeling of warmth in your hip area. Get help right away if:  You cannot move your hip.  You have severe pain. This information is not intended to replace advice given to you by your health care provider. Make sure you discuss any questions you have with your health care provider. Document Released: 11/19/2001 Document Revised: 11/05/2015 Document Reviewed: 12/30/2014 Elsevier Interactive Patient Education  2018 [employer] Breast self-awareness means being familiar with how your breasts look and feel. It involves checking your breasts regularly and reporting any changes to your health care provider. Practicing breast self-awareness is important. A change in your breasts can be a sign of a serious medical problem. Being familiar with how your breasts look and feel allows you to find any problems early, when treatment is more likely to be successful. All women should practice breast self-awareness, including women who have had breast implants. How to do a breast self-exam One way to learn what is normal for your breasts and whether your breasts are changing is to do a breast self-exam. To do a breast self-exam: Look for Changes  1. Remove all the clothing above your waist. 2. Stand in front of a mirror in a room with good lighting. 3. Put your hands on your hips. 4. Push your hands firmly downward. 5. Compare your breasts in the mirror. Look for differences between them (asymmetry), such as: ? Differences in shape. ? Differences in size. ? Puckers, dips, and bumps in one breast and not the other. 6. Look at each breast for changes in your skin, such as: ? Redness. ? Scaly areas. 7. Look for changes in your nipples, such as: ? Discharge. ? Bleeding. ? Dimpling. ? Redness. ? A change in position. Feel for Changes  Carefully feel your breasts for lumps and changes. It is best to do this while lying on your back on the floor and again while sitting or standing in the shower or tub with soapy water on your skin. Feel each breast in the following way:  Place the arm on the side of the breast you are examining above your head.  Feel your breast with the other hand.  Start in the nipple area and make  inch (2 cm) overlapping circles to feel your breast. Use the pads of your three middle fingers to do this. Apply light pressure, then medium pressure, then firm pressure. The light pressure will  allow you to feel the tissue closest to the skin. The medium pressure will allow you to feel the tissue that is a little deeper. The firm pressure will allow you to feel the tissue close to the ribs.  Continue the overlapping circles, moving downward over the breast until you feel your ribs below your breast.  Move one finger-width toward the center of the body. Continue to use the  inch (2 cm) overlapping circles to feel your breast as you move slowly up toward your collarbone.  Continue the up and down exam using all three pressures until you reach your armpit.  Write Down What You Find  Write down what is normal for each breast and any changes that you find. Keep a written record with breast changes or normal findings for each breast. By writing this information down, you do not need to depend only on memory for size, tenderness, or location. Write down where you are in your menstrual cycle, if you are still menstruating. If you are having  trouble noticing differences in your breasts, do not get discouraged. With time you will become more familiar with the variations in your breasts and more comfortable with the exam. How often should I examine my breasts? Examine your breasts every month. If you are breastfeeding, the best time to examine your breasts is after a feeding or after using a breast pump. If you menstruate, the best time to examine your breasts is 5-7 days after your period is over. During your period, your breasts are lumpier, and it may be more difficult to notice changes. When should I see my health care provider? See your health care provider if you notice:  A change in shape or size of your breasts or nipples.  A change in the skin of your breast or nipples, such as a reddened or scaly area.  Unusual discharge from your nipples.  A lump or thick area that was not there before.  Pain in your breasts.  Anything that concerns you.  This information is not intended to  replace advice given to you by your health care provider. Make sure you discuss any questions you have with your health care provider. Document Released: 05/30/2005 Document Revised: 11/05/2015 Document Reviewed: 04/19/2015 Elsevier Interactive Patient Education  [employer].

## 2017-04-28 NOTE — Progress Notes (Signed)
Date:  04/28/2017   Name:  [name]   DOB:  [birth year]   MRN:  [id number]   Chief Complaint: Annual Exam [name] is a 51 y.o. female who presents today for her Complete Annual Exam. She feels well. She reports exercising walking. She reports she is sleeping well.   She also has a work form for [employer] Hip Pain   There was no injury mechanism. The pain is present in the left hip. The quality of the pain is described as aching. The pain has been fluctuating since onset. Pertinent negatives include no inability to bear weight or muscle weakness. The symptoms are aggravated by movement.     Review of Systems  Constitutional: Negative for chills, fatigue and fever.  HENT: Negative for congestion, hearing loss, tinnitus, trouble swallowing and voice change.   Eyes: Negative for visual disturbance.  Respiratory: Negative for cough, chest tightness, shortness of breath and wheezing.   Cardiovascular: Negative for chest pain, palpitations and leg swelling.  Gastrointestinal: Negative for abdominal pain, constipation, diarrhea and vomiting.  Endocrine: Negative for polydipsia and polyuria.  Genitourinary: Negative for dysuria, frequency, genital sores, vaginal bleeding and vaginal discharge.  Musculoskeletal: Positive for arthralgias (left hip). Negative for gait problem and joint swelling.  Skin: Negative for color change and rash.  Neurological: Negative for dizziness, tremors, light-headedness and headaches.  Hematological: Negative for adenopathy. Does not bruise/bleed easily.  Psychiatric/Behavioral: Negative for dysphoric mood and sleep disturbance. The patient is not nervous/anxious.     Patient Active Problem List   Diagnosis Date Noted  . H/O headache 03/29/2015    Prior to Admission medications   Not on File    Allergies  Allergen Reactions  . Penicillins     Past Surgical History:  Procedure Laterality Date  . ABDOMINAL HYSTERECTOMY  2013   PARTIAL - PROLAPSE   . TONSILLECTOMY    . TUBAL LIGATION      Social History   Tobacco Use  . Smoking status: Never Smoker  . Smokeless tobacco: Never Used  Substance Use Topics  . Alcohol use: No    Alcohol/week: 0.0 oz  . Drug use: Not on file     Medication list has been reviewed and updated.  PHQ 2/9 Scores 05/19/2016  PHQ - 2 Score 0    Physical Exam  Constitutional: She is oriented to person, place, and time. She appears well-developed and well-nourished. No distress.  HENT:  Head: Normocephalic and atraumatic.  Right Ear: Tympanic membrane and ear canal normal.  Left Ear: Tympanic membrane and ear canal normal.  Nose: Right sinus exhibits no maxillary sinus tenderness. Left sinus exhibits no maxillary sinus tenderness.  Mouth/Throat: Uvula is midline and oropharynx is clear and moist.  Eyes: Conjunctivae and EOM are normal. Right eye exhibits no discharge. Left eye exhibits no discharge. No scleral icterus.  Neck: Normal range of motion. Carotid bruit is not present. No erythema present. No thyromegaly present.  Cardiovascular: Normal rate, regular rhythm, normal heart sounds and normal pulses.  Pulmonary/Chest: Effort normal. No respiratory distress. She has no wheezes. Right breast exhibits no mass, no nipple discharge, no skin change and no tenderness. Left breast exhibits no mass, no nipple discharge, no skin change and no tenderness.  Abdominal: Soft. Bowel sounds are normal. There is no hepatosplenomegaly. There is no tenderness. There is no CVA tenderness.  Musculoskeletal: Normal range of motion.       Right hip: Normal.  Left hip: She exhibits tenderness. She exhibits normal range of motion, normal strength and no swelling.  Lymphadenopathy:    She has no cervical adenopathy.    She has no axillary adenopathy.  Neurological: She is alert and oriented to person, place, and time. She has normal reflexes. No cranial nerve deficit or sensory deficit.  Skin: Skin is warm, dry  and intact. No rash noted.  Psychiatric: She has a normal mood and affect. Her speech is normal and behavior is normal. Thought content normal.  Nursing note and vitals reviewed.   BP (!) 160/90   Pulse 69   Ht 5\' 3"  (1.6 m)   Wt 184 lb 12.8 oz (83.8 kg)   SpO2 99%   BMI 32.74 kg/m   Assessment and Plan: 1. Annual physical exam Encourage regular exercise and healthy diet - CBC with Differential/Platelet - Comprehensive metabolic panel - Lipid panel - TSH  2. Breast cancer screenin - [facility] DIGITAL SCREENING BILATERAL; Future  3. Colon cancer screening - Ambulatory referral to Gastroenterology  4. Trochanteric bursitis of left hip Advil 800 mg bid Heat several times a day   No orders of the defined types were placed in this encounter.   Partially dictated using Editor, commissioning. Any errors are unintentional.  Halina Maidens, MD [facility]  04/28/2017

## 2017-04-29 LAB — COMPREHENSIVE METABOLIC PANEL
ALBUMIN: 4.5 g/dL (ref 3.5–5.5)
ALK PHOS: 55 IU/L (ref 39–117)
ALT: 15 IU/L (ref 0–32)
AST: 17 IU/L (ref 0–40)
Albumin/Globulin Ratio: 1.7 (ref 1.2–2.2)
BUN / CREAT RATIO: 21 (ref 9–23)
BUN: 14 mg/dL (ref 6–24)
Bilirubin Total: 0.5 mg/dL (ref 0.0–1.2)
CALCIUM: 9.1 mg/dL (ref 8.7–10.2)
CO2: 24 mmol/L (ref 20–29)
CREATININE: 0.67 mg/dL (ref 0.57–1.00)
Chloride: 101 mmol/L (ref 96–106)
GFR, EST AFRICAN AMERICAN: 119 mL/min/{1.73_m2} (ref >59)
GFR, EST NON AFRICAN AMERICAN: 103 mL/min/{1.73_m2} (ref >59)
GLOBULIN, TOTAL: 2.7 g/dL (ref 1.5–4.5)
Glucose: 85 mg/dL (ref 65–99)
Potassium: 4.1 mmol/L (ref 3.5–5.2)
SODIUM: 140 mmol/L (ref 134–144)
TOTAL PROTEIN: 7.2 g/dL (ref 6.0–8.5)

## 2017-04-29 LAB — CBC WITH DIFFERENTIAL/PLATELET
Basophils Absolute: 0 10*3/uL (ref 0.0–0.2)
Basos: 0 %
EOS (ABSOLUTE): 0.1 10*3/uL (ref 0.0–0.4)
EOS: 3 %
HEMATOCRIT: 37.5 % (ref 34.0–46.6)
HEMOGLOBIN: 12.7 g/dL (ref 11.1–15.9)
IMMATURE GRANS (ABS): 0 10*3/uL (ref 0.0–0.1)
Immature Granulocytes: 0 %
LYMPHS ABS: 1.3 10*3/uL (ref 0.7–3.1)
LYMPHS: 28 %
MCH: 29.8 pg (ref 26.6–33.0)
MCHC: 33.9 g/dL (ref 31.5–35.7)
MCV: 88 fL (ref 79–97)
MONOCYTES: 9 %
Monocytes Absolute: 0.4 10*3/uL (ref 0.1–0.9)
Neutrophils Absolute: 2.8 10*3/uL (ref 1.4–7.0)
Neutrophils: 60 %
Platelets: 192 10*3/uL (ref 150–379)
RBC: 4.26 x10E6/uL (ref 3.77–5.28)
RDW: 13.1 % (ref 12.3–15.4)
WBC: 4.7 10*3/uL (ref 3.4–10.8)

## 2017-04-29 LAB — TSH: TSH: 1.24 u[IU]/mL (ref 0.450–4.500)

## 2017-04-29 LAB — LIPID PANEL
CHOL/HDL RATIO: 2.8 ratio (ref 0.0–4.4)
Cholesterol, Total: 157 mg/dL (ref 100–199)
HDL: 56 mg/dL (ref >39)
LDL CALC: 90 mg/dL (ref 0–99)
Triglycerides: 57 mg/dL (ref 0–149)
VLDL CHOLESTEROL CAL: 11 mg/dL (ref 5–40)

## 2017-05-22 ENCOUNTER — Encounter: Payer: BLUE CROSS/BLUE SHIELD | Admitting: Internal Medicine

## 2017-06-27 ENCOUNTER — Telehealth: Payer: Self-pay | Admitting: Gastroenterology

## 2017-06-27 NOTE — Telephone Encounter (Signed)
Patient called and would like to schedule her procedure for June 2019.

## 2017-06-29 NOTE — Telephone Encounter (Signed)
Left pt message stating that we would schedule colonoscopy in May for June 2019.  Recall letter will be sent.

## 2017-07-14 ENCOUNTER — Encounter: Payer: Self-pay | Admitting: *Deleted

## 2017-08-08 ENCOUNTER — Ambulatory Visit
Admission: RE | Admit: 2017-08-08 | Discharge: 2017-08-08 | Disposition: A | Discharge status: Home / Self Care | Payer: 59 | Source: Ambulatory Visit | Attending: Internal Medicine | Admitting: Internal Medicine

## 2017-08-08 DIAGNOSIS — Z1231 Encounter for screening mammogram for malignant neoplasm of breast: Secondary | ICD-10-CM | POA: Insufficient documentation

## 2017-08-08 DIAGNOSIS — Z1239 Encounter for other screening for malignant neoplasm of breast: Secondary | ICD-10-CM

## 2018-02-15 ENCOUNTER — Encounter: Payer: Self-pay | Admitting: Internal Medicine

## 2018-02-15 ENCOUNTER — Ambulatory Visit: Payer: 59 | Admitting: Internal Medicine

## 2018-02-15 VITALS — BP 140/82 | HR 80 | Temp 97.9°F | Ht 63.0 in | Wt 184.0 lb

## 2018-02-15 DIAGNOSIS — J019 Acute sinusitis, unspecified: Secondary | ICD-10-CM | POA: Diagnosis not present

## 2018-02-15 MED ORDER — AZITHROMYCIN 250 MG PO TABS: ORAL_TABLET | ORAL | 0 refills | Status: AC

## 2018-02-15 NOTE — Progress Notes (Signed)
Date:  02/15/2018   Name:  [name]   DOB:  06-05-1966   MRN:  [id number]   Chief Complaint: Cough (X 1.5 weeks. Itchy throat. Coughing- no production. No fever. When blowing nose there is green mucous. Facial congestion and right ear stopped up.)  Sinus Problem  This is a new problem. The current episode started 1 to 4 weeks ago. The problem has been gradually worsening since onset. There has been no fever. She is experiencing no pain. Associated symptoms include congestion, coughing, ear pain, sinus pressure and a sore throat. Pertinent negatives include no chills, headaches, shortness of breath or swollen glands. Past treatments include nothing.      Review of Systems  Constitutional: Negative for chills, fatigue and fever.  HENT: Positive for congestion, ear pain, postnasal drip, sinus pressure and sore throat. Negative for trouble swallowing and voice change.   Respiratory: Positive for cough. Negative for shortness of breath.   Cardiovascular: Negative for chest pain and palpitations.  Neurological: Negative for dizziness, light-headedness and headaches.    Patient Active Problem List   Diagnosis Date Noted  . H/O headache 03/29/2015    Allergies  Allergen Reactions  . Penicillins     Past Surgical History:  Procedure Laterality Date  . ABDOMINAL HYSTERECTOMY  2013   PARTIAL - PROLAPSE  . TONSILLECTOMY    . TUBAL LIGATION      Social History   Tobacco Use  . Smoking status: Never Smoker  . Smokeless tobacco: Never Used  Substance Use Topics  . Alcohol use: No    Alcohol/week: 0.0 standard drinks  . Drug use: Not on file     Medication list has been reviewed and updated.  No outpatient medications have been marked as taking for the 02/15/18 encounter (Office Visit) with Glean Hess, MD.    [facility] 2/9 Scores 02/15/2018 05/19/2016  PHQ - 2 Score 0 0    Physical Exam  Constitutional: She is oriented to person, place, and time. She appears  well-developed and well-nourished.  HENT:  Right Ear: External ear and ear canal normal. Tympanic membrane is retracted. Tympanic membrane is not erythematous.  Left Ear: External ear and ear canal normal. Tympanic membrane is not erythematous and not retracted.  Nose: Right sinus exhibits maxillary sinus tenderness. Right sinus exhibits no frontal sinus tenderness. Left sinus exhibits maxillary sinus tenderness. Left sinus exhibits no frontal sinus tenderness.  Mouth/Throat: Uvula is midline and mucous membranes are normal. No oral lesions. No oropharyngeal exudate or posterior oropharyngeal erythema.  Cardiovascular: Normal rate, regular rhythm and normal heart sounds.  Pulmonary/Chest: Breath sounds normal. She has no wheezes. She has no rales.  Lymphadenopathy:    She has no cervical adenopathy.  Neurological: She is alert and oriented to person, place, and time.    BP 140/82 (BP Location: Right Arm, Patient Position: Sitting, Cuff Size: Normal)   Pulse 80   Temp 97.9 F (36.6 C) (Oral)   Ht 5\' 3"  (1.6 m)   Wt 184 lb (83.5 kg)   SpO2 99%   BMI 32.59 kg/m   Assessment and Plan: 1. Acute non-recurrent sinusitis, unspecified location Recommend sudafed 30 mg tid Delsym as needed for cough - azithromycin (ZITHROMAX Z-PAK) 250 MG tablet; UAD  Dispense: 6 each; Refill: 0   Meds ordered this encounter  Medications  . azithromycin (ZITHROMAX Z-PAK) 250 MG tablet    Sig: UAD    Dispense:  6 each    Refill:  0    Partially dictated using Editor, commissioning. Any errors are unintentional.  Halina Maidens, MD [facility]  02/15/2018   There are no diagnoses linked to this encounter.

## 2018-02-15 NOTE — Patient Instructions (Addendum)
Sudafed 30 mg 2-3 times per day  Delsym over the counter cough suppressant

## 2018-05-03 ENCOUNTER — Ambulatory Visit (INDEPENDENT_AMBULATORY_CARE_PROVIDER_SITE_OTHER): Payer: 59 | Admitting: Internal Medicine

## 2018-05-03 ENCOUNTER — Encounter: Payer: Self-pay | Admitting: Internal Medicine

## 2018-05-03 VITALS — BP 142/84 | HR 75 | Ht 63.0 in | Wt 182.0 lb

## 2018-05-03 DIAGNOSIS — Z0000 Encounter for general adult medical examination without abnormal findings: Secondary | ICD-10-CM

## 2018-05-03 DIAGNOSIS — Z1231 Encounter for screening mammogram for malignant neoplasm of breast: Secondary | ICD-10-CM | POA: Diagnosis not present

## 2018-05-03 DIAGNOSIS — Z1211 Encounter for screening for malignant neoplasm of colon: Secondary | ICD-10-CM

## 2018-05-03 LAB — POCT URINALYSIS DIPSTICK
BILIRUBIN UA: NEGATIVE
Glucose, UA: NEGATIVE
KETONES UA: NEGATIVE
Leukocytes, UA: NEGATIVE
Nitrite, UA: NEGATIVE
PH UA: 6 (ref 5.0–8.0)
Protein, UA: NEGATIVE
RBC UA: NEGATIVE
SPEC GRAV UA: 1.01 (ref 1.010–1.025)
UROBILINOGEN UA: 0.2 U/dL

## 2018-05-03 NOTE — Patient Instructions (Signed)

## 2018-05-03 NOTE — Progress Notes (Signed)
Date:  05/03/2018   Name:  [name]   DOB:  1966-02-26   MRN:  [id number]   Chief Complaint: Annual Exam (Breast Exam. )  [name] is a 52 y.o. female who presents today for her Complete Annual Exam. She feels well. She reports exercising some walking not structured. She reports she is sleeping well. Mammogram is due in February. She has no breast complaints.  She has already had her influenza vaccine.  She is overdue for colonoscopy.  HPI  Review of Systems  Constitutional: Negative for chills, fatigue and fever.  HENT: Negative for congestion, hearing loss, tinnitus, trouble swallowing and voice change.   Eyes: Negative for visual disturbance.  Respiratory: Negative for cough, chest tightness, shortness of breath and wheezing.   Cardiovascular: Negative for chest pain, palpitations and leg swelling.  Gastrointestinal: Negative for abdominal pain, constipation, diarrhea and vomiting.  Endocrine: Negative for polydipsia and polyuria.  Genitourinary: Negative for dysuria, frequency, genital sores, vaginal bleeding and vaginal discharge.  Musculoskeletal: Negative for arthralgias, gait problem and joint swelling.  Skin: Negative for color change and rash.  Allergic/Immunologic: Negative for environmental allergies.  Neurological: Negative for dizziness, tremors, light-headedness and headaches.  Hematological: Negative for adenopathy. Does not bruise/bleed easily.  Psychiatric/Behavioral: Negative for dysphoric mood and sleep disturbance. The patient is not nervous/anxious.     Patient Active Problem List   Diagnosis Date Noted  . H/O headache 03/29/2015    Allergies  Allergen Reactions  . Penicillins     Past Surgical History:  Procedure Laterality Date  . ABDOMINAL HYSTERECTOMY  2013   PARTIAL - PROLAPSE  . TONSILLECTOMY    . TUBAL LIGATION      Social History   Tobacco Use  . Smoking status: Never Smoker  . Smokeless tobacco: Never Used  Substance Use  Topics  . Alcohol use: No    Alcohol/week: 0.0 standard drinks  . Drug use: Not on file     Medication list has been reviewed and updated.  No outpatient medications have been marked as taking for the 05/03/18 encounter (Office Visit) with Glean Hess, MD.    [facility] 2/9 Scores 05/03/2018 02/15/2018 05/19/2016  PHQ - 2 Score 0 0 0    Physical Exam  Constitutional: She is oriented to person, place, and time. She appears well-developed and well-nourished. No distress.  HENT:  Head: Normocephalic and atraumatic.  Right Ear: Tympanic membrane and ear canal normal.  Left Ear: Tympanic membrane and ear canal normal.  Nose: Right sinus exhibits no maxillary sinus tenderness. Left sinus exhibits no maxillary sinus tenderness.  Mouth/Throat: Uvula is midline and oropharynx is clear and moist.  Eyes: Conjunctivae and EOM are normal. Right eye exhibits no discharge. Left eye exhibits no discharge. No scleral icterus.  Neck: Normal range of motion. Carotid bruit is not present. No erythema present. No thyromegaly present.  Cardiovascular: Normal rate, regular rhythm, normal heart sounds and normal pulses.  Pulmonary/Chest: Effort normal. No respiratory distress. She has no wheezes. Right breast exhibits no mass, no nipple discharge, no skin change and no tenderness. Left breast exhibits no mass, no nipple discharge, no skin change and no tenderness.  Abdominal: Soft. Bowel sounds are normal. There is no hepatosplenomegaly. There is no tenderness. There is no CVA tenderness.  Musculoskeletal: Normal range of motion.  Lymphadenopathy:    She has no cervical adenopathy.    She has no axillary adenopathy.  Neurological: She is alert and oriented to person,  place, and time. She has normal reflexes. No cranial nerve deficit or sensory deficit.  Skin: Skin is warm, dry and intact. No rash noted.  Psychiatric: She has a normal mood and affect. Her speech is normal and behavior is normal. Thought  content normal.  Nursing note and vitals reviewed.   BP (!) 142/84 (BP Location: Right Arm, Patient Position: Sitting, Cuff Size: Normal)   Pulse 75   Ht 5\' 3"  (1.6 m)   Wt 182 lb (82.6 kg)   SpO2 99%   BMI 32.24 kg/m   Assessment and Plan: 1. Annual physical exam Normal exam except for weight - encourage regular exercise - CBC with Differential/Platelet - Comprehensive metabolic panel - Lipid panel - POCT urinalysis dipstick - TSH  2. Encounter for screening mammogram for breast cancer To be scheduled at [place]; Future  3. Colon cancer screening - Ambulatory referral to Gastroenterology   Partially dictated using Dragon software. Any errors are unintentional.  Halina Maidens, MD [facility]  05/03/2018

## 2018-05-04 LAB — CBC WITH DIFFERENTIAL/PLATELET
BASOS: 1 %
Basophils Absolute: 0 10*3/uL (ref 0.0–0.2)
EOS (ABSOLUTE): 0.1 10*3/uL (ref 0.0–0.4)
Eos: 3 %
HEMOGLOBIN: 13 g/dL (ref 11.1–15.9)
Hematocrit: 38.9 % (ref 34.0–46.6)
IMMATURE GRANS (ABS): 0 10*3/uL (ref 0.0–0.1)
Immature Granulocytes: 0 %
Lymphocytes Absolute: 1.6 10*3/uL (ref 0.7–3.1)
Lymphs: 31 %
MCH: 30.4 pg (ref 26.6–33.0)
MCHC: 33.4 g/dL (ref 31.5–35.7)
MCV: 91 fL (ref 79–97)
Monocytes Absolute: 0.4 10*3/uL (ref 0.1–0.9)
Monocytes: 8 %
NEUTROS ABS: 2.9 10*3/uL (ref 1.4–7.0)
Neutrophils: 57 %
Platelets: 210 10*3/uL (ref 150–450)
RBC: 4.28 x10E6/uL (ref 3.77–5.28)
RDW: 11.8 % — ABNORMAL LOW (ref 12.3–15.4)
WBC: 5.1 10*3/uL (ref 3.4–10.8)

## 2018-05-04 LAB — COMPREHENSIVE METABOLIC PANEL
A/G RATIO: 1.3 (ref 1.2–2.2)
ALBUMIN: 4 g/dL (ref 3.5–5.5)
ALT: 14 IU/L (ref 0–32)
AST: 17 IU/L (ref 0–40)
Alkaline Phosphatase: 53 IU/L (ref 39–117)
BUN / CREAT RATIO: 26 — AB (ref 9–23)
BUN: 19 mg/dL (ref 6–24)
Bilirubin Total: 0.4 mg/dL (ref 0.0–1.2)
CHLORIDE: 106 mmol/L (ref 96–106)
CO2: 24 mmol/L (ref 20–29)
Calcium: 9.3 mg/dL (ref 8.7–10.2)
Creatinine, Ser: 0.73 mg/dL (ref 0.57–1.00)
GFR calc non Af Amer: 96 mL/min/{1.73_m2} (ref >59)
GFR, EST AFRICAN AMERICAN: 110 mL/min/{1.73_m2} (ref >59)
Globulin, Total: 3 g/dL (ref 1.5–4.5)
Glucose: 96 mg/dL (ref 65–99)
POTASSIUM: 4.3 mmol/L (ref 3.5–5.2)
Sodium: 144 mmol/L (ref 134–144)
TOTAL PROTEIN: 7 g/dL (ref 6.0–8.5)

## 2018-05-04 LAB — TSH: TSH: 0.748 u[IU]/mL (ref 0.450–4.500)

## 2018-05-04 LAB — LIPID PANEL
Chol/HDL Ratio: 2.7 ratio (ref 0.0–4.4)
Cholesterol, Total: 152 mg/dL (ref 100–199)
HDL: 57 mg/dL (ref >39)
LDL Calculated: 87 mg/dL (ref 0–99)
Triglycerides: 38 mg/dL (ref 0–149)
VLDL Cholesterol Cal: 8 mg/dL (ref 5–40)

## 2018-05-09 ENCOUNTER — Other Ambulatory Visit: Payer: Self-pay

## 2018-05-09 DIAGNOSIS — Z1211 Encounter for screening for malignant neoplasm of colon: Secondary | ICD-10-CM

## 2018-05-23 ENCOUNTER — Encounter: Payer: 59 | Admitting: Internal Medicine

## 2018-05-28 ENCOUNTER — Other Ambulatory Visit: Payer: Self-pay

## 2018-05-28 DIAGNOSIS — Z0189 Encounter for other specified special examinations: Secondary | ICD-10-CM

## 2018-05-29 ENCOUNTER — Other Ambulatory Visit: Payer: Self-pay

## 2018-05-29 DIAGNOSIS — Z0189 Encounter for other specified special examinations: Secondary | ICD-10-CM

## 2018-06-05 LAB — NICOTINE/COTININE METABOLITES
Cotinine: NOT DETECTED ng/mL
NICOTINE: NOT DETECTED ng/mL

## 2018-06-07 NOTE — Progress Notes (Signed)
Patient informed. Faxed patients work form to [employer].

## 2018-06-29 ENCOUNTER — Ambulatory Visit: Payer: 59 | Admitting: Anesthesiology

## 2018-06-29 ENCOUNTER — Ambulatory Visit
Admission: RE | Admit: 2018-06-29 | Discharge: 2018-06-29 | Disposition: A | Discharge status: Home / Self Care | Payer: 59 | Attending: Gastroenterology | Admitting: Gastroenterology

## 2018-06-29 ENCOUNTER — Encounter
Admission: RE | Disposition: A | Discharge status: Home / Self Care | Payer: Self-pay | Source: Home / Self Care | Attending: Gastroenterology

## 2018-06-29 ENCOUNTER — Encounter: Payer: Self-pay | Admitting: *Deleted

## 2018-06-29 DIAGNOSIS — D122 Benign neoplasm of ascending colon: Secondary | ICD-10-CM

## 2018-06-29 DIAGNOSIS — Z1211 Encounter for screening for malignant neoplasm of colon: Secondary | ICD-10-CM | POA: Diagnosis not present

## 2018-06-29 DIAGNOSIS — D126 Benign neoplasm of colon, unspecified: Secondary | ICD-10-CM | POA: Diagnosis not present

## 2018-06-29 DIAGNOSIS — K635 Polyp of colon: Secondary | ICD-10-CM | POA: Diagnosis not present

## 2018-06-29 HISTORY — PX: COLONOSCOPY WITH PROPOFOL: SHX5780

## 2018-06-29 SURGERY — COLONOSCOPY WITH PROPOFOL
Anesthesia: General

## 2018-06-29 MED ORDER — EPHEDRINE SULFATE 50 MG/ML IJ SOLN
INTRAMUSCULAR | Status: AC
  Filled 2018-06-29: qty 1

## 2018-06-29 MED ORDER — PROPOFOL 10 MG/ML IV BOLUS
INTRAVENOUS | Status: DC | PRN
  Administered 2018-06-29: 30 mg via INTRAVENOUS
  Administered 2018-06-29: 40 mg via INTRAVENOUS
  Administered 2018-06-29: 70 mg via INTRAVENOUS

## 2018-06-29 MED ORDER — SODIUM CHLORIDE 0.9 % IV SOLN
INTRAVENOUS | Status: DC
  Administered 2018-06-29: 1000 mL via INTRAVENOUS

## 2018-06-29 MED ORDER — PROPOFOL 500 MG/50ML IV EMUL
INTRAVENOUS | Status: AC
  Filled 2018-06-29: qty 50

## 2018-06-29 MED ORDER — LIDOCAINE HCL (CARDIAC) PF 100 MG/5ML IV SOSY
PREFILLED_SYRINGE | INTRAVENOUS | Status: DC | PRN
  Administered 2018-06-29: 50 mg via INTRAVENOUS

## 2018-06-29 MED ORDER — LIDOCAINE HCL (PF) 2 % IJ SOLN
INTRAMUSCULAR | Status: AC
  Filled 2018-06-29: qty 10

## 2018-06-29 MED ORDER — PROPOFOL 500 MG/50ML IV EMUL
INTRAVENOUS | Status: DC | PRN
  Administered 2018-06-29: 175 ug/kg/min via INTRAVENOUS

## 2018-06-29 NOTE — Anesthesia Post-op Follow-up Note (Signed)
Anesthesia QCD[employer] form completed.        

## 2018-06-29 NOTE — Transfer of Care (Signed)
Immediate Anesthesia Transfer of Care Note  Patient: [name]  Procedure(s) Performed: COLONOSCOPY WITH PROPOFOL (N/A )  Patient Location: PACU  Anesthesia Type:General  Level of Consciousness: drowsy  Airway & Oxygen Therapy: Patient Spontanous Breathing and Patient connected to nasal cannula oxygen  Post-op Assessment: Report given to RN and Post -op Vital signs reviewed and stable  Post vital signs: Reviewed and stable  Last Vitals:  Vitals Value Taken Time  BP 92/64 06/29/2018  8:45 AM  Temp 36.1 C 06/29/2018  8:40 AM  Pulse 66 06/29/2018  8:46 AM  Resp 16 06/29/2018  8:46 AM  SpO2 100 % 06/29/2018  8:46 AM  Vitals shown include unvalidated device data.  Last Pain:  Vitals:   06/29/18 0840  TempSrc: Tympanic      Patients Stated Pain Goal: 0 (98/61/48 [id number])  Complications: No apparent anesthesia complications

## 2018-06-29 NOTE — H&P (Signed)
Vonda Antigua, MD [address], [place], [place], [place], [address] [id number], [place], [place], [place], [address] Phone: [phone]  Fax: [phone]  Primary Care Physician:  Glean Hess, MD   Pre-Procedure History & Physical: HPI:  [name] is a 53 y.o. female is here for a colonoscopy.   History reviewed. No pertinent past medical history.  Past Surgical History:  Procedure Laterality Date  . ABDOMINAL HYSTERECTOMY  2013   PARTIAL - PROLAPSE  . TONSILLECTOMY    . TUBAL LIGATION      Prior to Admission medications   Not on File    Allergies as of 05/09/2018 - Review Complete 05/03/2018  Allergen Reaction Noted  . Penicillins  03/29/2015    Family History  Problem Relation Age of Onset  . Hypertension Father   . Melanoma Father   . Colon polyps Father   . Osteoporosis Mother   . Colon cancer Paternal Grandmother   . Breast cancer Neg Hx     Social History   Socioeconomic History  . Marital status: Married    Spouse name: Not on file  . Number of children: 3  . Years of education: Not on file  . Highest education level: Not on file  Occupational History  . Not on file  Social Needs  . Financial resource strain: Not hard at all  . Food insecurity:    Worry: Never true    Inability: Never true  . Transportation needs:    Medical: No    Non-medical: No  Tobacco Use  . Smoking status: Never Smoker  . Smokeless tobacco: Never Used  Substance and Sexual Activity  . Alcohol use: No    Alcohol/week: 0.0 standard drinks  . Drug use: Never  . Sexual activity: Not on file  Lifestyle  . Physical activity:    Days per week: 5 days    Minutes per session: 60 min  . Stress: Not at all  Relationships  . Social connections:    Talks on phone: More than three times a week    Gets together: Three times a week    Attends religious service: More than 4 times per year    Active member of club or organization: No    Attends meetings of  clubs or organizations: Never    Relationship status: Married  . Intimate partner violence:    Fear of current or ex partner: No    Emotionally abused: No    Physically abused: No    Forced sexual activity: No  Other Topics Concern  . Not on file  Social History Narrative  . Not on file    Review of Systems: See HPI, otherwise negative ROS  Physical Exam: BP 126/85   Pulse 76   Temp (!) 96.3 F (35.7 C) (Tympanic)   Resp 18   Ht 5' 3.5" (1.613 m)   Wt 81.6 kg   SpO2 99%   BMI 31.39 kg/m  General:   Alert,  pleasant and cooperative in NAD Head:  Normocephalic and atraumatic. Neck:  Supple; no masses or thyromegaly. Lungs:  Clear throughout to auscultation, normal respiratory effort.    Heart:  +S1, +S2, Regular rate and rhythm, No edema. Abdomen:  Soft, nontender and nondistended. Normal bowel sounds, without guarding, and without rebound.   Neurologic:  Alert and  oriented x4;  grossly normal neurologically.  Impression/Plan: [name] is here for a colonoscopy to be performed for average risk screening.  Risks, benefits, limitations, and alternatives regarding  colonoscopy have been reviewed with the patient.  Questions have been answered.  All parties agreeable.   Virgel Manifold, MD  06/29/2018, 8:12 AM

## 2018-06-29 NOTE — Anesthesia Procedure Notes (Signed)
Date/Time: 06/29/2018 8:13 AM Performed by: Johnna Acosta, CRNA Pre-anesthesia Checklist: Patient identified, Emergency Drugs available, Suction available, Timeout performed and Patient being monitored Patient Re-evaluated:Patient Re-evaluated prior to induction Oxygen Delivery Method: Nasal cannula Preoxygenation: Pre-oxygenation with 100% oxygen Induction Type: IV induction

## 2018-06-29 NOTE — Anesthesia Postprocedure Evaluation (Signed)
Anesthesia Post Note  Patient: [name]  Procedure(s) Performed: COLONOSCOPY WITH PROPOFOL (N/A )  Patient location during evaluation: Endoscopy Anesthesia Type: General Level of consciousness: awake and alert and oriented Pain management: pain level controlled Vital Signs Assessment: post-procedure vital signs reviewed and stable Respiratory status: spontaneous breathing, nonlabored ventilation and respiratory function stable Cardiovascular status: blood pressure returned to baseline and stable Postop Assessment: no signs of nausea or vomiting Anesthetic complications: no     Last Vitals:  Vitals:   06/29/18 0900 06/29/18 0910  BP: 128/81 120/70  Pulse: 63 91  Resp: 13 13  Temp:    SpO2: 99% 100%    Last Pain:  Vitals:   06/29/18 0840  TempSrc: Tympanic                 [name]

## 2018-06-29 NOTE — Op Note (Signed)
[facility] Gastroenterology Patient Name: [name] Procedure Date: 06/29/2018 7:48 AM MRN: [id number] Account #: [id number] Date of Birth: [birth year] Admit Type: Outpatient Age: 53 Room: [facility] ENDO ROOM 2 Gender: Female Note Status: Finalized Procedure:            Colonoscopy Indications:          Screening for colorectal malignant neoplasm Providers:            Varnita B. Bonna Gains MD, MD Referring MD:         Halina Maidens, MD (Referring MD) Medicines:            Monitored Anesthesia Care Complications:        No immediate complications. Procedure:            Pre-Anesthesia Assessment:                       - ASA Grade Assessment: II - A patient with mild                        systemic disease.                       - Prior to the procedure, a History and Physical was                        performed, and patient medications, allergies and                        sensitivities were reviewed. The patient's tolerance of                        previous anesthesia was reviewed.                       - The risks and benefits of the procedure and the                        sedation options and risks were discussed with the                        patient. All questions were answered and informed                        consent was obtained.                       - Patient identification and proposed procedure were                        verified prior to the procedure by the physician, the                        nurse, the anesthesiologist, the anesthetist and the                        technician. The procedure was verified in the procedure                        room.  After obtaining informed consent, the colonoscope was                        passed under direct vision. Throughout the procedure,                        the patient's blood pressure, pulse, and oxygen                        saturations were monitored continuously. The                Colonoscope was introduced through the anus and                        advanced to the the cecum, identified by appendiceal                        orifice and ileocecal valve. The colonoscopy was                        performed with ease. The patient tolerated the                        procedure well. The quality of the bowel preparation                        was good. Findings:      The perianal and digital rectal examinations were normal.      A 6 mm polyp was found in the ascending colon. The polyp was sessile.       The polyp was removed with a cold snare. Resection and retrieval were       complete.      The exam was otherwise without abnormality.      The rectum, sigmoid colon, descending colon, transverse colon, ascending       colon and cecum appeared normal.      The retroflexed view of the distal rectum and anal verge was normal and       showed no anal or rectal abnormalities. Impression:           - One 6 mm polyp in the ascending colon, removed with a                        cold snare. Resected and retrieved.                       - The examination was otherwise normal.                       - The rectum, sigmoid colon, descending colon,                        transverse colon, ascending colon and cecum are normal.                       - The distal rectum and anal verge are normal on                        retroflexion view. Recommendation:       - Discharge patient to home (with escort).                       -  Advance diet as tolerated.                       - Continue present medications.                       - Await pathology results.                       - Repeat colonoscopy in 5 years.                       - The findings and recommendations were discussed with                        the patient.                       - The findings and recommendations were discussed with                        the patient's family.                       - Return  to primary care physician as previously                        scheduled. Procedure Code(s):    --- Professional ---                       [id number], Colonoscopy, flexible; with removal of tumor(s),                        polyp(s), or other lesion(s) by snare technique Diagnosis Code(s):    --- Professional ---                       Z12.11, Encounter for screening for malignant neoplasm                        of colon                       D12.2, Benign neoplasm of ascending colon CPT copyright 2018 American Medical Association. All rights reserved. The codes documented in this report are preliminary and upon coder review may  be revised to meet current compliance requirements.  Vonda Antigua, MD Margretta Sidle B. Bonna Gains MD, MD 06/29/2018 8:43:47 AM This report has been signed electronically. Number of Addenda: 0 Note Initiated On: 06/29/2018 7:48 AM Scope Withdrawal Time: 0 hours 15 minutes 33 seconds  Total Procedure Duration: 0 hours 22 minutes 9 seconds  Estimated Blood Loss: Estimated blood loss: none.      [facility]

## 2018-06-29 NOTE — Anesthesia Preprocedure Evaluation (Signed)
Anesthesia Evaluation  Patient identified by MRN, date of birth, ID band Patient awake    Reviewed: Allergy & Precautions, NPO status , Patient's Chart, lab work & pertinent test results  History of Anesthesia Complications Negative for: history of anesthetic complications  Airway Mallampati: II  TM Distance: >3 FB Neck ROM: Full    Dental no notable dental hx.    Pulmonary neg pulmonary ROS, neg sleep apnea, neg COPD,    breath sounds clear to auscultation- rhonchi (-) wheezing      Cardiovascular Exercise Tolerance: Good (-) hypertension(-) CAD and (-) Past MI  Rhythm:Regular Rate:Normal - Systolic murmurs and - Diastolic murmurs    Neuro/Psych negative neurological ROS  negative psych ROS   GI/Hepatic negative GI ROS, Neg liver ROS,   Endo/Other  negative endocrine ROSneg diabetes  Renal/GU negative Renal ROS     Musculoskeletal negative musculoskeletal ROS (+)   Abdominal (+) + obese,   Peds  Hematology negative hematology ROS (+)   Anesthesia Other Findings   Reproductive/Obstetrics                             Anesthesia Physical Anesthesia Plan  ASA: II  Anesthesia Plan: General   Post-op Pain Management:    Induction: Intravenous  PONV Risk Score and Plan: 2 and Propofol infusion  Airway Management Planned: Natural Airway  Additional Equipment:   Intra-op Plan:   Post-operative Plan:   Informed Consent: I have reviewed the patients History and Physical, chart, labs and discussed the procedure including the risks, benefits and alternatives for the proposed anesthesia with the patient or authorized representative who has indicated his/her understanding and acceptance.     Dental advisory given  Plan Discussed with: CRNA and Anesthesiologist  Anesthesia Plan Comments:         Anesthesia Quick Evaluation  

## 2018-07-02 ENCOUNTER — Encounter: Payer: Self-pay | Admitting: Gastroenterology

## 2018-07-02 ENCOUNTER — Encounter: Payer: Self-pay | Admitting: Internal Medicine

## 2018-07-02 LAB — SURGICAL PATHOLOGY

## 2018-08-14 ENCOUNTER — Ambulatory Visit
Admission: RE | Admit: 2018-08-14 | Discharge: 2018-08-14 | Disposition: A | Discharge status: Home / Self Care | Payer: 59 | Source: Ambulatory Visit | Attending: Internal Medicine | Admitting: Internal Medicine

## 2018-08-14 DIAGNOSIS — Z1231 Encounter for screening mammogram for malignant neoplasm of breast: Secondary | ICD-10-CM | POA: Diagnosis not present

## 2018-08-22 ENCOUNTER — Other Ambulatory Visit: Payer: Self-pay | Admitting: Internal Medicine

## 2018-08-22 DIAGNOSIS — Z20828 Contact with and (suspected) exposure to other viral communicable diseases: Secondary | ICD-10-CM

## 2018-08-22 MED ORDER — OSELTAMIVIR PHOSPHATE 75 MG PO CAPS: 75.0000 mg | ORAL_CAPSULE | Freq: Every day | ORAL | 0 refills | Status: AC

## 2018-11-27 ENCOUNTER — Other Ambulatory Visit: Payer: Self-pay | Admitting: Internal Medicine

## 2018-11-27 ENCOUNTER — Telehealth: Payer: Self-pay | Admitting: General Practice

## 2018-11-27 ENCOUNTER — Telehealth: Payer: Self-pay

## 2018-11-27 DIAGNOSIS — Z20822 Contact with and (suspected) exposure to covid-19: Secondary | ICD-10-CM

## 2018-11-27 NOTE — Telephone Encounter (Signed)
Patient called requesting advice.  Her son, who lives in her home has tested [facility]. He was tested Sunday 11/25/2018 and found out he was positive last night.  The patient has no symptoms currently but her husband does. She is asking what our protocol is for this situation.  Informed her we can do a telephone visit for her husband who is sick, and she will need to quarantine and if she develops symptoms then we can do a visit with her as well. After telephone visit Dr. Army Melia then sends for testing if needed.   Patient said she has been doing research and it says that even if you don't have symptoms, you need to be tested after being around a positive covid. She said she doesn't want to wait. She wants to go to [facility] that her son went to and be sent for testing with her husband.  Told her this is risky because if they do have Covid this could put others at risk since they are going outside of there home to another facility.  She verbalized understanding of this but said she would much rather go to urgent care and got off the phone.

## 2018-11-27 NOTE — Telephone Encounter (Signed)
Pt has been scheduled for covid testing.  Scheduled with pt directly at [facility]. Pt was referred by: Glean Hess, MD

## 2018-11-27 NOTE — Telephone Encounter (Signed)
-----   Message from Glean Hess, MD sent at 11/27/2018  3:19 PM EDT ----- Regarding: suspected Covid Patients 52 yo son tested positive.  Her husband Azarah Dacy) has sx and is being referred for testing.  Today she had a headache but no fever yet.

## 2018-11-27 NOTE — Addendum Note (Signed)
Addended by: Dimple Nanas on: 11/27/2018 04:17 PM   Modules accepted: Orders

## 2018-11-28 ENCOUNTER — Other Ambulatory Visit: Payer: 59

## 2018-11-28 DIAGNOSIS — Z20822 Contact with and (suspected) exposure to covid-19: Secondary | ICD-10-CM

## 2018-11-28 DIAGNOSIS — R6889 Other general symptoms and signs: Secondary | ICD-10-CM | POA: Diagnosis not present

## 2018-11-29 LAB — NOVEL CORONAVIRUS, NAA: SARS-CoV-2, NAA: NOT DETECTED

## 2019-05-08 ENCOUNTER — Encounter: Payer: Self-pay | Admitting: Internal Medicine

## 2019-05-08 ENCOUNTER — Other Ambulatory Visit: Payer: Self-pay

## 2019-05-08 ENCOUNTER — Ambulatory Visit (INDEPENDENT_AMBULATORY_CARE_PROVIDER_SITE_OTHER): Payer: 59 | Admitting: Internal Medicine

## 2019-05-08 VITALS — BP 132/68 | HR 63 | Ht 63.5 in | Wt 174.0 lb

## 2019-05-08 DIAGNOSIS — Z23 Encounter for immunization: Secondary | ICD-10-CM | POA: Diagnosis not present

## 2019-05-08 DIAGNOSIS — Z1231 Encounter for screening mammogram for malignant neoplasm of breast: Secondary | ICD-10-CM

## 2019-05-08 DIAGNOSIS — Z0000 Encounter for general adult medical examination without abnormal findings: Secondary | ICD-10-CM

## 2019-05-08 LAB — POCT URINALYSIS DIPSTICK
Bilirubin, UA: NEGATIVE
Blood, UA: NEGATIVE
Glucose, UA: NEGATIVE
Ketones, UA: NEGATIVE
Leukocytes, UA: NEGATIVE
Nitrite, UA: NEGATIVE
Protein, UA: NEGATIVE
Spec Grav, UA: 1.01 (ref 1.010–1.025)
Urobilinogen, UA: 0.2 E.U./dL
pH, UA: 6.5 (ref 5.0–8.0)

## 2019-05-08 NOTE — Progress Notes (Signed)
Date:  05/08/2019   Name:  [name]   DOB:  October 06, 1965   MRN:  [id number]   Chief Complaint: Annual Exam (Breast Exam. ) [name] is a 53 y.o. female who presents today for her Complete Annual Exam. She feels well. She reports exercising walking 40 min 4 times a week. She reports she is sleeping well. She denies breast issues. She would like to receive Shingrix.  Mammogram  08/2018 Colonoscopy  06/2018 Immunization History  Administered Date(s) Administered  . Influenza,inj,quad, With Preservative 04/03/2019  . Influenza-Unspecified 03/28/2015, 03/30/2016, 03/23/2018  . Tdap 11/11/2009    HPI  Lab Results  Component Value Date   CREATININE 0.73 05/03/2018   BUN 19 05/03/2018   NA 144 05/03/2018   K 4.3 05/03/2018   CL 106 05/03/2018   CO2 24 05/03/2018   Lab Results  Component Value Date   CHOL 152 05/03/2018   HDL 57 05/03/2018   LDLCALC 87 05/03/2018   TRIG 38 05/03/2018   CHOLHDL 2.7 05/03/2018   Lab Results  Component Value Date   TSH 0.748 05/03/2018   No results found for: HGBA1C   Review of Systems  Constitutional: Negative for chills, fatigue and fever.  HENT: Negative for congestion, hearing loss, tinnitus, trouble swallowing and voice change.   Eyes: Negative for visual disturbance.  Respiratory: Negative for cough, chest tightness, shortness of breath and wheezing.   Cardiovascular: Negative for chest pain, palpitations and leg swelling.  Gastrointestinal: Negative for abdominal pain, constipation, diarrhea and vomiting.  Endocrine: Negative for polydipsia and polyuria.  Genitourinary: Negative for dysuria, frequency, genital sores, vaginal bleeding and vaginal discharge.  Musculoskeletal: Negative for arthralgias, gait problem and joint swelling.  Skin: Negative for color change and rash.  Neurological: Negative for dizziness, tremors, light-headedness and headaches.  Hematological: Negative for adenopathy. Does not bruise/bleed easily.   Psychiatric/Behavioral: Negative for dysphoric mood and sleep disturbance. The patient is not nervous/anxious.     Patient Active Problem List   Diagnosis Date Noted  . Tubular adenoma of colon   . H/O headache 03/29/2015    Allergies  Allergen Reactions  . Penicillins     Past Surgical History:  Procedure Laterality Date  . ABDOMINAL HYSTERECTOMY  2013   PARTIAL - PROLAPSE  . COLONOSCOPY WITH PROPOFOL N/A 06/29/2018   Procedure: COLONOSCOPY WITH PROPOFOL;  Surgeon: Virgel Manifold, MD;  Location: [facility] ENDOSCOPY;  Service: Endoscopy;  Laterality: N/A;  . TONSILLECTOMY    . TUBAL LIGATION      Social History   Tobacco Use  . Smoking status: Never Smoker  . Smokeless tobacco: Never Used  Substance Use Topics  . Alcohol use: No    Alcohol/week: 0.0 standard drinks  . Drug use: Never     Medication list has been reviewed and updated.  No outpatient medications have been marked as taking for the 05/08/19 encounter (Office Visit) with Glean Hess, MD.    [facility] 2/9 Scores 05/08/2019 05/03/2018 02/15/2018 05/19/2016  PHQ - 2 Score 0 0 0 0    BP Readings from Last 3 Encounters:  05/08/19 132/68  06/29/18 120/70  05/03/18 (!) 142/84    Physical Exam Vitals signs and nursing note reviewed.  Constitutional:      General: She is not in acute distress.    Appearance: She is well-developed.  HENT:     Head: Normocephalic and atraumatic.     Right Ear: Tympanic membrane and ear canal normal.  Left Ear: Tympanic membrane and ear canal normal.     Nose:     Right Sinus: No maxillary sinus tenderness.     Left Sinus: No maxillary sinus tenderness.  Eyes:     General: No scleral icterus.       Right eye: No discharge.        Left eye: No discharge.     Conjunctiva/sclera: Conjunctivae normal.  Neck:     Musculoskeletal: Normal range of motion. No erythema.     Thyroid: No thyromegaly.     Vascular: No carotid bruit.  Cardiovascular:     Rate and Rhythm:  Normal rate and regular rhythm.     Pulses: Normal pulses.     Heart sounds: Normal heart sounds.  Pulmonary:     Effort: Pulmonary effort is normal. No respiratory distress.     Breath sounds: No wheezing.  Chest:     Breasts:        Right: No mass, nipple discharge, skin change or tenderness.        Left: No mass, nipple discharge, skin change or tenderness.  Abdominal:     General: Bowel sounds are normal.     Palpations: Abdomen is soft.     Tenderness: There is no abdominal tenderness.  Musculoskeletal: Normal range of motion.  Lymphadenopathy:     Cervical: No cervical adenopathy.  Skin:    General: Skin is warm and dry.     Findings: No rash.  Neurological:     Mental Status: She is alert and oriented to person, place, and time.     Cranial Nerves: No cranial nerve deficit.     Sensory: No sensory deficit.     Deep Tendon Reflexes: Reflexes are normal and symmetric.  Psychiatric:        Attention and Perception: Attention normal.        Mood and Affect: Mood normal.        Speech: Speech normal.        Behavior: Behavior normal.        Thought Content: Thought content normal.     Wt Readings from Last 3 Encounters:  05/08/19 174 lb (78.9 kg)  06/29/18 180 lb (81.6 kg)  05/03/18 182 lb (82.6 kg)    BP 132/68   Pulse 63   Ht 5' 3.5" (1.613 m)   Wt 174 lb (78.9 kg)   SpO2 99%   BMI 30.34 kg/m   Assessment and Plan: 1. Annual physical exam Normal exam Continue healthy diet and exercise - CBC with Differential/Platelet - Comprehensive metabolic panel - Lipid panel - TSH - POCT urinalysis dipstick  2. Encounter for screening mammogram for breast cancer Schedule in March 2021 - MM 3D SCREEN BREAST BILATERAL; Future  3. Need for shingles vaccine - Shingrix in [employer]. Any errors are unintentional.  Nancy Maidens, MD [facility]  05/08/2019

## 2019-05-09 LAB — CBC WITH DIFFERENTIAL/PLATELET
Basophils Absolute: 0 10*3/uL (ref 0.0–0.2)
Basos: 1 %
EOS (ABSOLUTE): 0.2 10*3/uL (ref 0.0–0.4)
Eos: 4 %
Hematocrit: 38.9 % (ref 34.0–46.6)
Hemoglobin: 12.9 g/dL (ref 11.1–15.9)
Immature Grans (Abs): 0 10*3/uL (ref 0.0–0.1)
Immature Granulocytes: 0 %
Lymphocytes Absolute: 1.3 10*3/uL (ref 0.7–3.1)
Lymphs: 27 %
MCH: 30.3 pg (ref 26.6–33.0)
MCHC: 33.2 g/dL (ref 31.5–35.7)
MCV: 91 fL (ref 79–97)
Monocytes Absolute: 0.3 10*3/uL (ref 0.1–0.9)
Monocytes: 7 %
Neutrophils Absolute: 2.8 10*3/uL (ref 1.4–7.0)
Neutrophils: 61 %
Platelets: 201 10*3/uL (ref 150–450)
RBC: 4.26 x10E6/uL (ref 3.77–5.28)
RDW: 11.8 % (ref 11.7–15.4)
WBC: 4.6 10*3/uL (ref 3.4–10.8)

## 2019-05-09 LAB — COMPREHENSIVE METABOLIC PANEL
ALT: 18 IU/L (ref 0–32)
AST: 20 IU/L (ref 0–40)
Albumin/Globulin Ratio: 1.7 (ref 1.2–2.2)
Albumin: 4.3 g/dL (ref 3.8–4.9)
Alkaline Phosphatase: 53 IU/L (ref 39–117)
BUN/Creatinine Ratio: 24 — ABNORMAL HIGH (ref 9–23)
BUN: 16 mg/dL (ref 6–24)
Bilirubin Total: 0.5 mg/dL (ref 0.0–1.2)
CO2: 24 mmol/L (ref 20–29)
Calcium: 9.2 mg/dL (ref 8.7–10.2)
Chloride: 102 mmol/L (ref 96–106)
Creatinine, Ser: 0.68 mg/dL (ref 0.57–1.00)
GFR calc Af Amer: 116 mL/min/{1.73_m2} (ref >59)
GFR calc non Af Amer: 101 mL/min/{1.73_m2} (ref >59)
Globulin, Total: 2.5 g/dL (ref 1.5–4.5)
Glucose: 89 mg/dL (ref 65–99)
Potassium: 4.3 mmol/L (ref 3.5–5.2)
Sodium: 141 mmol/L (ref 134–144)
Total Protein: 6.8 g/dL (ref 6.0–8.5)

## 2019-05-09 LAB — LIPID PANEL
Chol/HDL Ratio: 2.5 ratio (ref 0.0–4.4)
Cholesterol, Total: 158 mg/dL (ref 100–199)
HDL: 64 mg/dL (ref >39)
LDL Chol Calc (NIH): 84 mg/dL (ref 0–99)
Triglycerides: 48 mg/dL (ref 0–149)
VLDL Cholesterol Cal: 10 mg/dL (ref 5–40)

## 2019-05-09 LAB — TSH: TSH: 0.967 u[IU]/mL (ref 0.450–4.500)

## 2019-05-29 DIAGNOSIS — Z20828 Contact with and (suspected) exposure to other viral communicable diseases: Secondary | ICD-10-CM | POA: Diagnosis not present

## 2019-08-12 ENCOUNTER — Ambulatory Visit: Payer: 59

## 2019-08-20 ENCOUNTER — Ambulatory Visit
Admission: RE | Admit: 2019-08-20 | Discharge: 2019-08-20 | Disposition: A | Discharge status: Home / Self Care | Payer: 59 | Source: Ambulatory Visit | Attending: Internal Medicine | Admitting: Internal Medicine

## 2019-08-20 DIAGNOSIS — Z1231 Encounter for screening mammogram for malignant neoplasm of breast: Secondary | ICD-10-CM | POA: Insufficient documentation

## 2019-09-04 ENCOUNTER — Telehealth: Payer: Self-pay

## 2019-09-04 NOTE — Telephone Encounter (Signed)
Pt has a appt set up for 10/11/19 for 2nd shingles shot

## 2019-09-04 NOTE — Telephone Encounter (Signed)
Patient canceled her appt this month. Was supposed to get her 2nd shingles vaccine. Please call the pt to schedule a nurse visit before 11/05/19.  Thank you.  - CM

## 2019-10-11 ENCOUNTER — Ambulatory Visit: Payer: 59

## 2019-10-14 ENCOUNTER — Ambulatory Visit (INDEPENDENT_AMBULATORY_CARE_PROVIDER_SITE_OTHER): Payer: 59

## 2019-10-14 ENCOUNTER — Other Ambulatory Visit: Payer: Self-pay

## 2019-10-14 DIAGNOSIS — Z23 Encounter for immunization: Secondary | ICD-10-CM | POA: Diagnosis not present

## 2020-05-11 ENCOUNTER — Other Ambulatory Visit: Payer: Self-pay

## 2020-05-11 ENCOUNTER — Ambulatory Visit (INDEPENDENT_AMBULATORY_CARE_PROVIDER_SITE_OTHER): Payer: 59 | Admitting: Internal Medicine

## 2020-05-11 ENCOUNTER — Encounter: Payer: Self-pay | Admitting: Internal Medicine

## 2020-05-11 VITALS — BP 138/78 | HR 65 | Temp 97.8°F | Ht 63.0 in | Wt 171.0 lb

## 2020-05-11 DIAGNOSIS — Z0000 Encounter for general adult medical examination without abnormal findings: Secondary | ICD-10-CM

## 2020-05-11 DIAGNOSIS — M778 Other enthesopathies, not elsewhere classified: Secondary | ICD-10-CM | POA: Diagnosis not present

## 2020-05-11 DIAGNOSIS — Z90710 Acquired absence of both cervix and uterus: Secondary | ICD-10-CM | POA: Diagnosis not present

## 2020-05-11 DIAGNOSIS — Z1231 Encounter for screening mammogram for malignant neoplasm of breast: Secondary | ICD-10-CM | POA: Diagnosis not present

## 2020-05-11 DIAGNOSIS — Z1159 Encounter for screening for other viral diseases: Secondary | ICD-10-CM

## 2020-05-11 LAB — POCT URINALYSIS DIPSTICK
Bilirubin, UA: NEGATIVE
Blood, UA: NEGATIVE
Glucose, UA: NEGATIVE
Ketones, UA: NEGATIVE
Leukocytes, UA: NEGATIVE
Nitrite, UA: NEGATIVE
Protein, UA: NEGATIVE
Spec Grav, UA: <=1.005 — AB (ref 1.010–1.025)
Urobilinogen, UA: 0.2 E.U./dL
pH, UA: 7 (ref 5.0–8.0)

## 2020-05-11 NOTE — Patient Instructions (Signed)
Aleve 2 tablets twice a day Ice the shoulder at the end of the day for 10-15 minutes

## 2020-05-11 NOTE — Progress Notes (Signed)
Date:  05/11/2020   Name:  [name]   DOB:  19-Jun-1965   MRN:  [id number]   Chief Complaint: Annual Exam  [name] is a 54 y.o. female who presents today for her Complete Annual Exam. She feels well. She reports exercising - none. She reports she is sleeping fairly well. Breast complaints - none.  Mammogram: 08/2019 DEXA: none Pap smear: discontinued Colonoscopy: 06/2018 repeat 5 yrs  Immunization History  Administered Date(s) Administered  . Influenza,inj,quad, With Preservative 04/03/2019  . Influenza-Unspecified 03/30/2016, 03/23/2018, 03/27/2020  . Moderna SARS-COVID-2 Vaccination 08/17/2019, 09/21/2019, 04/23/2020  . Tdap 11/11/2009  . Zoster Recombinat (Shingrix) 05/08/2019, 10/14/2019    Shoulder Pain  The pain is present in the left shoulder. This is a new problem. The current episode started more than 1 month ago. There has been no history of extremity trauma. The problem occurs daily. The problem has been unchanged. The quality of the pain is described as aching. The pain is mild. Associated symptoms include a limited range of motion. Pertinent negatives include no fever. The symptoms are aggravated by activity and lying down. She has tried NSAIDS for the symptoms. The treatment provided mild relief.    Lab Results  Component Value Date   CREATININE 0.68 05/08/2019   BUN 16 05/08/2019   NA 141 05/08/2019   K 4.3 05/08/2019   CL 102 05/08/2019   CO2 24 05/08/2019   Lab Results  Component Value Date   CHOL 158 05/08/2019   HDL 64 05/08/2019   LDLCALC 84 05/08/2019   TRIG 48 05/08/2019   CHOLHDL 2.5 05/08/2019   Lab Results  Component Value Date   TSH 0.967 05/08/2019   No results found for: HGBA1C Lab Results  Component Value Date   WBC 4.6 05/08/2019   HGB 12.9 05/08/2019   HCT 38.9 05/08/2019   MCV 91 05/08/2019   PLT 201 05/08/2019   Lab Results  Component Value Date   ALT 18 05/08/2019   AST 20 05/08/2019   ALKPHOS 53 05/08/2019    BILITOT 0.5 05/08/2019     Review of Systems  Constitutional: Negative for chills, fatigue and fever.  HENT: Negative for congestion, hearing loss, tinnitus, trouble swallowing and voice change.   Eyes: Negative for visual disturbance.  Respiratory: Negative for cough, chest tightness, shortness of breath and wheezing.   Cardiovascular: Negative for chest pain, palpitations and leg swelling.  Gastrointestinal: Negative for abdominal pain, constipation, diarrhea and vomiting.  Endocrine: Negative for polydipsia and polyuria.  Genitourinary: Negative for dysuria, frequency, genital sores, vaginal bleeding and vaginal discharge.  Musculoskeletal: Positive for arthralgias (left shoulder and upper arm pain). Negative for gait problem and joint swelling.  Skin: Negative for color change and rash.  Neurological: Negative for dizziness, tremors, light-headedness and headaches.  Hematological: Negative for adenopathy. Does not bruise/bleed easily.  Psychiatric/Behavioral: Negative for dysphoric mood and sleep disturbance. The patient is not nervous/anxious.     Patient Active Problem List   Diagnosis Date Noted  . S/P TAH (total abdominal hysterectomy) 05/11/2020  . Tubular adenoma of colon   . H/O headache 03/29/2015    Allergies  Allergen Reactions  . Penicillins     Past Surgical History:  Procedure Laterality Date  . ABDOMINAL HYSTERECTOMY  2013   PARTIAL - PROLAPSE  . COLONOSCOPY WITH PROPOFOL N/A 06/29/2018   Procedure: COLONOSCOPY WITH PROPOFOL;  Surgeon: Virgel Manifold, MD;  Location: [facility];  Service: Endoscopy;  Laterality: N/A;  .  TONSILLECTOMY    . TUBAL LIGATION      Social History   Tobacco Use  . Smoking status: Never Smoker  . Smokeless tobacco: Never Used  Substance Use Topics  . Alcohol use: No    Alcohol/week: 0.0 standard drinks  . Drug use: Never     Medication list has been reviewed and updated.  No outpatient medications have been  marked as taking for the 05/11/20 encounter (Office Visit) with Glean Hess, MD.    [facility] 2/9 Scores 05/11/2020 05/08/2019 05/03/2018 02/15/2018  PHQ - 2 Score 0 0 0 0  PHQ- 9 Score 0 - - -    GAD 7 : Generalized Anxiety Score 05/11/2020  Nervous, Anxious, on Edge 0  Control/stop worrying 0  Worry too much - different things 0  Trouble relaxing 0  Restless 0  Easily annoyed or irritable 0  Afraid - awful might happen 0  Total GAD 7 Score 0  Anxiety Difficulty Not difficult at all    BP Readings from Last 3 Encounters:  05/11/20 138/78  05/08/19 132/68  06/29/18 120/70    Physical Exam Vitals and nursing note reviewed.  Constitutional:      General: She is not in acute distress.    Appearance: She is well-developed.  HENT:     Head: Normocephalic and atraumatic.     Right Ear: Tympanic membrane and ear canal normal.     Left Ear: Tympanic membrane and ear canal normal.     Nose:     Right Sinus: No maxillary sinus tenderness.     Left Sinus: No maxillary sinus tenderness.  Eyes:     General: No scleral icterus.       Right eye: No discharge.        Left eye: No discharge.     Conjunctiva/sclera: Conjunctivae normal.  Neck:     Thyroid: No thyromegaly.     Vascular: No carotid bruit.  Cardiovascular:     Rate and Rhythm: Normal rate and regular rhythm.     Pulses: Normal pulses.     Heart sounds: Normal heart sounds.  Pulmonary:     Effort: Pulmonary effort is normal. No respiratory distress.     Breath sounds: No wheezing.  Chest:     Breasts:        Right: No mass, nipple discharge, skin change or tenderness.        Left: No mass, nipple discharge, skin change or tenderness.  Abdominal:     General: Bowel sounds are normal.     Palpations: Abdomen is soft.     Tenderness: There is no abdominal tenderness.  Musculoskeletal:     Right shoulder: Normal.     Left shoulder: Tenderness (over anterior biceps tendon) present.     Cervical back: Normal range  of motion. No erythema.     Right lower leg: No edema.     Left lower leg: No edema.  Lymphadenopathy:     Cervical: No cervical adenopathy.  Skin:    General: Skin is warm and dry.     Findings: No rash.  Neurological:     Mental Status: She is alert and oriented to person, place, and time.     Cranial Nerves: No cranial nerve deficit.     Sensory: No sensory deficit.     Deep Tendon Reflexes: Reflexes are normal and symmetric.  Psychiatric:        Attention and Perception: Attention normal.  Mood and Affect: Mood normal.     Wt Readings from Last 3 Encounters:  05/11/20 171 lb (77.6 kg)  05/08/19 174 lb (78.9 kg)  06/29/18 180 lb (81.6 kg)    BP 138/78   Pulse 65   Temp 97.8 F (36.6 C) (Oral)   Ht 5\' 3"  (1.6 m)   Wt 171 lb (77.6 kg)   SpO2 99%   BMI 30.29 kg/m   Assessment and Plan: 1. Annual physical exam Normal exam Recommend healthy diet, begin regular exericse - CBC with Differential/Platelet - Comprehensive metabolic panel - Lipid panel - POCT urinalysis dipstick - TSH  2. Encounter for screening mammogram for breast cancer Schedule at [facility] - MM 3D SCREEN BREAST BILATERAL; Future  3. S/P TAH (total abdominal hysterectomy) Pap no longer needed  4. Need for hepatitis C screening test - Hepatitis C antibody  5. Shoulder tendonitis, left Begin Aleve 2 tabs bid and ice May need Ortho referral   Partially dictated using Editor, commissioning. Any errors are unintentional.  Halina Maidens, MD [facility]  05/11/2020

## 2020-05-12 LAB — CBC WITH DIFFERENTIAL/PLATELET
Basophils Absolute: 0 10*3/uL (ref 0.0–0.2)
Basos: 1 %
EOS (ABSOLUTE): 0.3 10*3/uL (ref 0.0–0.4)
Eos: 6 %
Hematocrit: 39.8 % (ref 34.0–46.6)
Hemoglobin: 13.4 g/dL (ref 11.1–15.9)
Immature Grans (Abs): 0 10*3/uL (ref 0.0–0.1)
Immature Granulocytes: 0 %
Lymphocytes Absolute: 1.3 10*3/uL (ref 0.7–3.1)
Lymphs: 27 %
MCH: 30.1 pg (ref 26.6–33.0)
MCHC: 33.7 g/dL (ref 31.5–35.7)
MCV: 89 fL (ref 79–97)
Monocytes Absolute: 0.4 10*3/uL (ref 0.1–0.9)
Monocytes: 8 %
Neutrophils Absolute: 2.8 10*3/uL (ref 1.4–7.0)
Neutrophils: 58 %
Platelets: 204 10*3/uL (ref 150–450)
RBC: 4.45 x10E6/uL (ref 3.77–5.28)
RDW: 11.8 % (ref 11.7–15.4)
WBC: 4.8 10*3/uL (ref 3.4–10.8)

## 2020-05-12 LAB — COMPREHENSIVE METABOLIC PANEL
ALT: 18 IU/L (ref 0–32)
AST: 21 IU/L (ref 0–40)
Albumin/Globulin Ratio: 1.6 (ref 1.2–2.2)
Albumin: 4.4 g/dL (ref 3.8–4.9)
Alkaline Phosphatase: 53 IU/L (ref 44–121)
BUN/Creatinine Ratio: 27 — ABNORMAL HIGH (ref 9–23)
BUN: 17 mg/dL (ref 6–24)
Bilirubin Total: 0.4 mg/dL (ref 0.0–1.2)
CO2: 24 mmol/L (ref 20–29)
Calcium: 9.3 mg/dL (ref 8.7–10.2)
Chloride: 101 mmol/L (ref 96–106)
Creatinine, Ser: 0.64 mg/dL (ref 0.57–1.00)
GFR calc Af Amer: 118 mL/min/{1.73_m2} (ref >59)
GFR calc non Af Amer: 102 mL/min/{1.73_m2} (ref >59)
Globulin, Total: 2.7 g/dL (ref 1.5–4.5)
Glucose: 91 mg/dL (ref 65–99)
Potassium: 4.1 mmol/L (ref 3.5–5.2)
Sodium: 140 mmol/L (ref 134–144)
Total Protein: 7.1 g/dL (ref 6.0–8.5)

## 2020-05-12 LAB — HEPATITIS C ANTIBODY: Hep C Virus Ab: <0.1 s/co ratio (ref 0.0–0.9)

## 2020-05-12 LAB — LIPID PANEL
Chol/HDL Ratio: 3 ratio (ref 0.0–4.4)
Cholesterol, Total: 180 mg/dL (ref 100–199)
HDL: 61 mg/dL (ref >39)
LDL Chol Calc (NIH): 102 mg/dL — ABNORMAL HIGH (ref 0–99)
Triglycerides: 91 mg/dL (ref 0–149)
VLDL Cholesterol Cal: 17 mg/dL (ref 5–40)

## 2020-05-12 LAB — TSH: TSH: 1.01 u[IU]/mL (ref 0.450–4.500)

## 2020-06-15 ENCOUNTER — Other Ambulatory Visit: Payer: 59

## 2020-06-15 DIAGNOSIS — Z20822 Contact with and (suspected) exposure to covid-19: Secondary | ICD-10-CM | POA: Diagnosis not present

## 2020-06-16 LAB — SARS-COV-2, NAA 2 DAY TAT

## 2020-06-16 LAB — NOVEL CORONAVIRUS, NAA: SARS-CoV-2, NAA: DETECTED — AB

## 2020-06-19 ENCOUNTER — Encounter: Payer: Self-pay | Admitting: Internal Medicine

## 2020-08-20 ENCOUNTER — Ambulatory Visit
Admission: RE | Admit: 2020-08-20 | Discharge: 2020-08-20 | Disposition: A | Discharge status: Home / Self Care | Payer: 59 | Source: Ambulatory Visit | Attending: Internal Medicine | Admitting: Internal Medicine

## 2020-08-20 ENCOUNTER — Other Ambulatory Visit: Payer: Self-pay

## 2020-08-20 DIAGNOSIS — Z1231 Encounter for screening mammogram for malignant neoplasm of breast: Secondary | ICD-10-CM | POA: Insufficient documentation

## 2021-05-17 ENCOUNTER — Encounter: Payer: Self-pay | Admitting: Internal Medicine

## 2021-05-17 ENCOUNTER — Ambulatory Visit (INDEPENDENT_AMBULATORY_CARE_PROVIDER_SITE_OTHER): Payer: 59 | Admitting: Internal Medicine

## 2021-05-17 ENCOUNTER — Other Ambulatory Visit: Payer: Self-pay

## 2021-05-17 VITALS — BP 124/84 | HR 65 | Temp 97.5°F | Ht 63.0 in | Wt 169.6 lb

## 2021-05-17 DIAGNOSIS — Z1231 Encounter for screening mammogram for malignant neoplasm of breast: Secondary | ICD-10-CM

## 2021-05-17 DIAGNOSIS — E785 Hyperlipidemia, unspecified: Secondary | ICD-10-CM | POA: Diagnosis not present

## 2021-05-17 DIAGNOSIS — Z0000 Encounter for general adult medical examination without abnormal findings: Secondary | ICD-10-CM

## 2021-05-17 DIAGNOSIS — Z6830 Body mass index (BMI) 30.0-30.9, adult: Secondary | ICD-10-CM | POA: Diagnosis not present

## 2021-05-17 NOTE — Progress Notes (Signed)
Date:  05/17/2021   Name:  [name]   DOB:  [birth year]   MRN:  [id number]   Chief Complaint: Annual Exam (Breast Exam. No pap.) [name] is a 55 y.o. female who presents today for her Complete Annual Exam. She feels well. She reports exercising / walking. She reports she is sleeping fairly well. Breast complaints - none.  Mammogram: 08/2020 DEXA: none Pap smear: discontinued Colonoscopy: 06/2018 repeat 5 yrs  Immunization History  Administered Date(s) Administered   Influenza,inj,quad, With Preservative 04/03/2019   Influenza-Unspecified 03/23/2018, 03/27/2020, 03/22/2021   Moderna Sars-Covid-2 Vaccination 08/17/2019, 09/21/2019, 04/23/2020   Tdap 11/11/2009   Zoster Recombinat (Shingrix) 05/08/2019, 10/14/2019    HPI  Lab Results  Component Value Date   NA 140 05/11/2020   K 4.1 05/11/2020   CO2 24 05/11/2020   GLUCOSE 91 05/11/2020   BUN 17 05/11/2020   CREATININE 0.64 05/11/2020   CALCIUM 9.3 05/11/2020   GFRNONAA 102 05/11/2020   Lab Results  Component Value Date   CHOL 180 05/11/2020   HDL 61 05/11/2020   LDLCALC 102 (H) 05/11/2020   TRIG 91 05/11/2020   CHOLHDL 3.0 05/11/2020   Lab Results  Component Value Date   TSH 1.010 05/11/2020   No results found for: HGBA1C Lab Results  Component Value Date   WBC 4.8 05/11/2020   HGB 13.4 05/11/2020   HCT 39.8 05/11/2020   MCV 89 05/11/2020   PLT 204 05/11/2020   Lab Results  Component Value Date   ALT 18 05/11/2020   AST 21 05/11/2020   ALKPHOS 53 05/11/2020   BILITOT 0.4 05/11/2020   No results found for: 25OHVITD2, 25OHVITD3, VD25OH   Review of Systems  Constitutional:  Negative for chills, fatigue and fever.  HENT:  Negative for congestion, hearing loss, tinnitus, trouble swallowing and voice change.   Eyes:  Negative for visual disturbance.  Respiratory:  Negative for cough, chest tightness, shortness of breath and wheezing.   Cardiovascular:  Positive for palpitations. Negative for chest  pain and leg swelling.  Gastrointestinal:  Negative for abdominal pain, constipation, diarrhea and vomiting.  Endocrine: Negative for polydipsia and polyuria.  Genitourinary:  Negative for dysuria, frequency, genital sores, vaginal bleeding and vaginal discharge.  Musculoskeletal:  Positive for joint swelling (mild lateral left ankle area). Negative for arthralgias and gait problem.  Skin:  Negative for color change and rash.  Neurological:  Negative for dizziness, tremors, light-headedness and headaches.  Hematological:  Negative for adenopathy. Does not bruise/bleed easily.  Psychiatric/Behavioral:  Negative for dysphoric mood and sleep disturbance. The patient is not nervous/anxious.    Patient Active Problem List   Diagnosis Date Noted   S/P TAH (total abdominal hysterectomy) 05/11/2020   Tubular adenoma of colon    H/O headache 03/29/2015    Allergies  Allergen Reactions   Penicillins     Past Surgical History:  Procedure Laterality Date   ABDOMINAL HYSTERECTOMY  2013   PARTIAL - PROLAPSE   COLONOSCOPY WITH PROPOFOL N/A 06/29/2018   Procedure: COLONOSCOPY WITH PROPOFOL;  Surgeon: Virgel Manifold, MD;  Location: [facility];  Service: Endoscopy;  Laterality: N/A;   TONSILLECTOMY     TUBAL LIGATION      Social History   Tobacco Use   Smoking status: Never   Smokeless tobacco: Never  Substance Use Topics   Alcohol use: No    Alcohol/week: 0.0 standard drinks   Drug use: Never     Medication list has  been reviewed and updated.  Current Meds  Medication Sig   cetirizine (ZYRTEC) 10 MG tablet Take 10 mg by mouth daily.    PHQ 2/9 Scores 05/17/2021 05/11/2020 05/08/2019 05/03/2018  PHQ - 2 Score 0 0 0 0  PHQ- 9 Score 0 0 - -    GAD 7 : Generalized Anxiety Score 05/17/2021 05/11/2020  Nervous, Anxious, on Edge 0 0  Control/stop worrying 0 0  Worry too much - different things 0 0  Trouble relaxing 0 0  Restless 0 0  Easily annoyed or irritable 0 0   Afraid - awful might happen 0 0  Total GAD 7 Score 0 0  Anxiety Difficulty Not difficult at all Not difficult at all    BP Readings from Last 3 Encounters:  05/17/21 124/84  05/11/20 138/78  05/08/19 132/68    Physical Exam Vitals and nursing note reviewed.  Constitutional:      General: She is not in acute distress.    Appearance: She is well-developed.  HENT:     Head: Normocephalic and atraumatic.     Right Ear: Tympanic membrane and ear canal normal.     Left Ear: Tympanic membrane and ear canal normal.     Nose:     Right Sinus: No maxillary sinus tenderness.     Left Sinus: No maxillary sinus tenderness.  Eyes:     General: No scleral icterus.       Right eye: No discharge.        Left eye: No discharge.     Conjunctiva/sclera: Conjunctivae normal.  Neck:     Thyroid: No thyromegaly.     Vascular: No carotid bruit.  Cardiovascular:     Rate and Rhythm: Normal rate and regular rhythm.     Pulses: Normal pulses.     Heart sounds: Normal heart sounds.  Pulmonary:     Effort: Pulmonary effort is normal. No respiratory distress.     Breath sounds: No wheezing.  Chest:  Breasts:    Right: No mass, nipple discharge, skin change or tenderness.     Left: No mass, nipple discharge, skin change or tenderness.  Abdominal:     General: Bowel sounds are normal.     Palpations: Abdomen is soft.     Tenderness: There is no abdominal tenderness.  Musculoskeletal:     Cervical back: Normal range of motion. No erythema.     Right lower leg: No edema.     Left lower leg: No edema.  Lymphadenopathy:     Cervical: No cervical adenopathy.  Skin:    General: Skin is warm and dry.     Findings: No rash.  Neurological:     Mental Status: She is alert and oriented to person, place, and time.     Cranial Nerves: No cranial nerve deficit.     Sensory: No sensory deficit.     Deep Tendon Reflexes: Reflexes are normal and symmetric.  Psychiatric:        Attention and Perception:  Attention normal.        Mood and Affect: Mood normal.    Wt Readings from Last 3 Encounters:  05/17/21 169 lb 9.6 oz (76.9 kg)  05/11/20 171 lb (77.6 kg)  05/08/19 174 lb (78.9 kg)    BP 124/84   Pulse 65   Temp (!) 97.5 F (36.4 C) (Oral)   Ht 5\' 3"  (1.6 m)   Wt 169 lb 9.6 oz (76.9 kg)   SpO2 99%  BMI 30.04 kg/m   Assessment and Plan: 1. Annual physical exam Normal exam Up to date on screenings and immunizations. - CBC with Differential/Platelet - Comprehensive metabolic panel - TSH  2. Encounter for screening mammogram for breast cancer Schedule at [facility] in March - MM 3D SCREEN BREAST BILATERAL  3. Mild hyperlipidemia Continue healthy diet and exercise Will advise if medication is needed - Lipid panel  4. BMI 30.0-30.9,adult - Hemoglobin A1c   Partially dictated using Editor, commissioning. Any errors are unintentional.  Halina Maidens, MD [facility]  05/17/2021

## 2021-05-18 LAB — CBC WITH DIFFERENTIAL/PLATELET
Basophils Absolute: 0 10*3/uL (ref 0.0–0.2)
Basos: 1 %
EOS (ABSOLUTE): 0.2 10*3/uL (ref 0.0–0.4)
Eos: 4 %
Hematocrit: 37.2 % (ref 34.0–46.6)
Hemoglobin: 12.5 g/dL (ref 11.1–15.9)
Immature Grans (Abs): 0 10*3/uL (ref 0.0–0.1)
Immature Granulocytes: 0 %
Lymphocytes Absolute: 1.4 10*3/uL (ref 0.7–3.1)
Lymphs: 30 %
MCH: 29.9 pg (ref 26.6–33.0)
MCHC: 33.6 g/dL (ref 31.5–35.7)
MCV: 89 fL (ref 79–97)
Monocytes Absolute: 0.4 10*3/uL (ref 0.1–0.9)
Monocytes: 8 %
Neutrophils Absolute: 2.6 10*3/uL (ref 1.4–7.0)
Neutrophils: 57 %
Platelets: 194 10*3/uL (ref 150–450)
RBC: 4.18 x10E6/uL (ref 3.77–5.28)
RDW: 11.8 % (ref 11.7–15.4)
WBC: 4.6 10*3/uL (ref 3.4–10.8)

## 2021-05-18 LAB — LIPID PANEL
Chol/HDL Ratio: 2.6 ratio (ref 0.0–4.4)
Cholesterol, Total: 155 mg/dL (ref 100–199)
HDL: 59 mg/dL (ref >39)
LDL Chol Calc (NIH): 87 mg/dL (ref 0–99)
Triglycerides: 42 mg/dL (ref 0–149)
VLDL Cholesterol Cal: 9 mg/dL (ref 5–40)

## 2021-05-18 LAB — HEMOGLOBIN A1C
Est. average glucose Bld gHb Est-mCnc: 105 mg/dL
Hgb A1c MFr Bld: 5.3 % (ref 4.8–5.6)

## 2021-05-18 LAB — COMPREHENSIVE METABOLIC PANEL
ALT: 12 IU/L (ref 0–32)
AST: 18 IU/L (ref 0–40)
Albumin/Globulin Ratio: 1.7 (ref 1.2–2.2)
Albumin: 4.4 g/dL (ref 3.8–4.9)
Alkaline Phosphatase: 55 IU/L (ref 44–121)
BUN/Creatinine Ratio: 28 — ABNORMAL HIGH (ref 9–23)
BUN: 18 mg/dL (ref 6–24)
Bilirubin Total: 0.3 mg/dL (ref 0.0–1.2)
CO2: 24 mmol/L (ref 20–29)
Calcium: 8.9 mg/dL (ref 8.7–10.2)
Chloride: 103 mmol/L (ref 96–106)
Creatinine, Ser: 0.64 mg/dL (ref 0.57–1.00)
Globulin, Total: 2.6 g/dL (ref 1.5–4.5)
Glucose: 83 mg/dL (ref 70–99)
Potassium: 4.2 mmol/L (ref 3.5–5.2)
Sodium: 140 mmol/L (ref 134–144)
Total Protein: 7 g/dL (ref 6.0–8.5)
eGFR: 105 mL/min/{1.73_m2} (ref >59)

## 2021-05-18 LAB — TSH: TSH: 1.4 u[IU]/mL (ref 0.450–4.500)

## 2021-05-27 ENCOUNTER — Telehealth: Payer: Self-pay

## 2021-05-27 NOTE — Telephone Encounter (Signed)
Called pt as a reminder to call and schedule a mammogram. Pt verbalized understanding.  KP

## 2021-08-24 ENCOUNTER — Other Ambulatory Visit: Payer: Self-pay

## 2021-08-24 ENCOUNTER — Ambulatory Visit
Admission: RE | Admit: 2021-08-24 | Discharge: 2021-08-24 | Disposition: A | Discharge status: Home / Self Care | Payer: 59 | Source: Ambulatory Visit | Attending: Internal Medicine | Admitting: Internal Medicine

## 2021-08-24 DIAGNOSIS — Z1231 Encounter for screening mammogram for malignant neoplasm of breast: Secondary | ICD-10-CM | POA: Diagnosis not present

## 2022-05-18 ENCOUNTER — Encounter: Payer: Self-pay | Admitting: Internal Medicine

## 2022-05-18 DIAGNOSIS — E785 Hyperlipidemia, unspecified: Secondary | ICD-10-CM | POA: Insufficient documentation

## 2022-05-19 ENCOUNTER — Encounter: Payer: Self-pay | Admitting: Internal Medicine

## 2022-05-19 ENCOUNTER — Ambulatory Visit (INDEPENDENT_AMBULATORY_CARE_PROVIDER_SITE_OTHER): Payer: 59 | Admitting: Internal Medicine

## 2022-05-19 VITALS — BP 132/84 | HR 67 | Ht 63.0 in | Wt 163.0 lb

## 2022-05-19 DIAGNOSIS — Z1231 Encounter for screening mammogram for malignant neoplasm of breast: Secondary | ICD-10-CM | POA: Diagnosis not present

## 2022-05-19 DIAGNOSIS — E785 Hyperlipidemia, unspecified: Secondary | ICD-10-CM

## 2022-05-19 DIAGNOSIS — Z131 Encounter for screening for diabetes mellitus: Secondary | ICD-10-CM

## 2022-05-19 DIAGNOSIS — Z0000 Encounter for general adult medical examination without abnormal findings: Secondary | ICD-10-CM

## 2022-05-19 NOTE — Progress Notes (Signed)
Date:  05/19/2022   Name:  [name]   DOB:  1965-08-11   MRN:  [id number]   Chief Complaint: Annual Exam (Breast exam no pap) [name] is a 56 y.o. female who presents today for her Complete Annual Exam. She feels well. She reports exercising/ walking. She reports she is sleeping well. Breast complaints - none.  Mammogram: 08/2021 DEXA: none Pap smear: discontinued Colonoscopy: 06/2018 repeat 5 yrs  Health Maintenance Due  Topic Date Due   DTaP/Tdap/Td (2 - Td or Tdap) 11/12/2019   COVID-19 Vaccine (4 - 2023-24 season) 02/11/2022    Immunization History  Administered Date(s) Administered   Influenza,inj,quad, With Preservative 04/03/2019   Influenza-Unspecified 03/27/2020, 03/22/2021, 03/17/2022   Moderna Sars-Covid-2 Vaccination 08/17/2019, 09/21/2019, 04/23/2020   Tdap 11/11/2009   Zoster Recombinat (Shingrix) 05/08/2019, 10/14/2019    HPI  Lab Results  Component Value Date   NA 140 05/17/2021   K 4.2 05/17/2021   CO2 24 05/17/2021   GLUCOSE 83 05/17/2021   BUN 18 05/17/2021   CREATININE 0.64 05/17/2021   CALCIUM 8.9 05/17/2021   EGFR 105 05/17/2021   GFRNONAA 102 05/11/2020   Lab Results  Component Value Date   CHOL 155 05/17/2021   HDL 59 05/17/2021   LDLCALC 87 05/17/2021   TRIG 42 05/17/2021   CHOLHDL 2.6 05/17/2021   Lab Results  Component Value Date   TSH 1.400 05/17/2021   Lab Results  Component Value Date   HGBA1C 5.3 05/17/2021   Lab Results  Component Value Date   WBC 4.6 05/17/2021   HGB 12.5 05/17/2021   HCT 37.2 05/17/2021   MCV 89 05/17/2021   PLT 194 05/17/2021   Lab Results  Component Value Date   ALT 12 05/17/2021   AST 18 05/17/2021   ALKPHOS 55 05/17/2021   BILITOT 0.3 05/17/2021   No results found for: "25OHVITD2", "25OHVITD3", "VD25OH"   Review of Systems  Constitutional:  Negative for chills, fatigue and fever.  HENT:  Negative for congestion, hearing loss, tinnitus, trouble swallowing and voice change.    Eyes:  Negative for visual disturbance.  Respiratory:  Negative for cough, chest tightness, shortness of breath and wheezing.   Cardiovascular:  Negative for chest pain, palpitations and leg swelling.  Gastrointestinal:  Negative for abdominal pain, blood in stool, constipation, diarrhea and vomiting.       Mild fecal urgency at times  Endocrine: Negative for polydipsia and polyuria.  Genitourinary:  Positive for urgency. Negative for dysuria, frequency, genital sores, vaginal bleeding and vaginal discharge.  Musculoskeletal:  Negative for arthralgias, gait problem and joint swelling.  Skin:  Negative for color change and rash.  Neurological:  Negative for dizziness, tremors, light-headedness and headaches.  Hematological:  Negative for adenopathy. Does not bruise/bleed easily.  Psychiatric/Behavioral:  Negative for dysphoric mood and sleep disturbance. The patient is not nervous/anxious.     Patient Active Problem List   Diagnosis Date Noted   Mild hyperlipidemia 05/18/2022   S/P TAH (total abdominal hysterectomy) 05/11/2020   Tubular adenoma of colon     Allergies  Allergen Reactions   Penicillins     Past Surgical History:  Procedure Laterality Date   COLONOSCOPY WITH PROPOFOL N/A 06/29/2018   Procedure: COLONOSCOPY WITH PROPOFOL;  Surgeon: Virgel Manifold, MD;  Location: [facility];  Service: Endoscopy;  Laterality: N/A;   TONSILLECTOMY     TOTAL ABDOMINAL HYSTERECTOMY  06/14/2011   Prolapse   TUBAL LIGATION  Social History   Tobacco Use   Smoking status: Never   Smokeless tobacco: Never  Substance Use Topics   Alcohol use: No    Alcohol/week: 0.0 standard drinks of alcohol   Drug use: Never     Medication list has been reviewed and updated.  Current Meds  Medication Sig   cetirizine (ZYRTEC) 10 MG tablet Take 10 mg by mouth daily.       05/19/2022    8:10 AM 05/17/2021    7:58 AM 05/11/2020    7:59 AM  GAD 7 : Generalized Anxiety Score   Nervous, Anxious, on Edge 0 0 0  Control/stop worrying 0 0 0  Worry too much - different things 0 0 0  Trouble relaxing 0 0 0  Restless 0 0 0  Easily annoyed or irritable 0 0 0  Afraid - awful might happen 0 0 0  Total GAD 7 Score 0 0 0  Anxiety Difficulty Not difficult at all Not difficult at all Not difficult at all       05/19/2022    8:10 AM 05/17/2021    7:58 AM 05/11/2020    7:59 AM  Depression screen PHQ 2/9  Decreased Interest 0 0 0  Down, Depressed, Hopeless 0 0 0  PHQ - 2 Score 0 0 0  Altered sleeping 0 0 0  Tired, decreased energy 0 0 0  Change in appetite 0 0 0  Feeling bad or failure about yourself  0 0 0  Trouble concentrating 0 0 0  Moving slowly or fidgety/restless 0 0 0  Suicidal thoughts 0 0 0  PHQ-9 Score 0 0 0  Difficult doing work/chores Not difficult at all Not difficult at all Not difficult at all    BP Readings from Last 3 Encounters:  05/19/22 132/84  05/17/21 124/84  05/11/20 138/78    Physical Exam Vitals and nursing note reviewed.  Constitutional:      General: She is not in acute distress.    Appearance: She is well-developed.  HENT:     Head: Normocephalic and atraumatic.     Right Ear: Tympanic membrane and ear canal normal.     Left Ear: Tympanic membrane and ear canal normal.     Nose:     Right Sinus: No maxillary sinus tenderness.     Left Sinus: No maxillary sinus tenderness.  Eyes:     General: No scleral icterus.       Right eye: No discharge.        Left eye: No discharge.     Conjunctiva/sclera: Conjunctivae normal.  Neck:     Thyroid: No thyromegaly.     Vascular: No carotid bruit.  Cardiovascular:     Rate and Rhythm: Normal rate and regular rhythm.     Pulses: Normal pulses.     Heart sounds: Normal heart sounds.  Pulmonary:     Effort: Pulmonary effort is normal. No respiratory distress.     Breath sounds: No wheezing.  Chest:  Breasts:    Right: No mass, nipple discharge, skin change or tenderness.      Left: No mass, nipple discharge, skin change or tenderness.  Abdominal:     General: Bowel sounds are normal.     Palpations: Abdomen is soft.     Tenderness: There is no abdominal tenderness.  Musculoskeletal:     Cervical back: Normal range of motion. No erythema.     Right lower leg: No edema.  Left lower leg: No edema.  Lymphadenopathy:     Cervical: No cervical adenopathy.  Skin:    General: Skin is warm and dry.     Findings: No rash.  Neurological:     Mental Status: She is alert and oriented to person, place, and time.     Cranial Nerves: No cranial nerve deficit.     Sensory: No sensory deficit.     Deep Tendon Reflexes: Reflexes are normal and symmetric.  Psychiatric:        Attention and Perception: Attention normal.        Mood and Affect: Mood normal.     Wt Readings from Last 3 Encounters:  05/19/22 163 lb (73.9 kg)  05/17/21 169 lb 9.6 oz (76.9 kg)  05/11/20 171 lb (77.6 kg)    BP 132/84 (BP Location: Right Arm, Cuff Size: Normal)   Pulse 67   Ht _0  (1.6 m)   Wt 163 lb (73.9 kg)   SpO2 98%   BMI 28.87 kg/m waist circ 38 in.  Assessment and Plan: 1. Annual physical exam Normal exam Doing well with diet and exercise Has lost a bit of weight over the past year - CBC with Differential/Platelet - Comprehensive metabolic panel - Hemoglobin A1c - Lipid panel - TSH  2. Encounter for screening mammogram for breast cancer Schedule at [facility] - MM 3D SCREEN BREAST BILATERAL  3. Mild hyperlipidemia - Lipid panel  4. Screening for diabetes mellitus - Comprehensive metabolic panel - Hemoglobin A1c   Partially dictated using Editor, commissioning. Any errors are unintentional.  Halina Maidens, MD [facility]  05/19/2022

## 2022-05-19 NOTE — Patient Instructions (Signed)
Call [facility] to schedule your mammogram at [phone].  

## 2022-05-20 LAB — COMPREHENSIVE METABOLIC PANEL
ALT: 13 IU/L (ref 0–32)
AST: 19 IU/L (ref 0–40)
Albumin/Globulin Ratio: 1.7 (ref 1.2–2.2)
Albumin: 4.2 g/dL (ref 3.8–4.9)
Alkaline Phosphatase: 52 IU/L (ref 44–121)
BUN/Creatinine Ratio: 25 — ABNORMAL HIGH (ref 9–23)
BUN: 16 mg/dL (ref 6–24)
Bilirubin Total: 0.5 mg/dL (ref 0.0–1.2)
CO2: 25 mmol/L (ref 20–29)
Calcium: 9.2 mg/dL (ref 8.7–10.2)
Chloride: 101 mmol/L (ref 96–106)
Creatinine, Ser: 0.65 mg/dL (ref 0.57–1.00)
Globulin, Total: 2.5 g/dL (ref 1.5–4.5)
Glucose: 91 mg/dL (ref 70–99)
Potassium: 4.1 mmol/L (ref 3.5–5.2)
Sodium: 140 mmol/L (ref 134–144)
Total Protein: 6.7 g/dL (ref 6.0–8.5)
eGFR: 104 mL/min/{1.73_m2} (ref >59)

## 2022-05-20 LAB — LIPID PANEL
Chol/HDL Ratio: 2.3 ratio (ref 0.0–4.4)
Cholesterol, Total: 150 mg/dL (ref 100–199)
HDL: 66 mg/dL (ref >39)
LDL Chol Calc (NIH): 75 mg/dL (ref 0–99)
Triglycerides: 39 mg/dL (ref 0–149)
VLDL Cholesterol Cal: 9 mg/dL (ref 5–40)

## 2022-05-20 LAB — TSH: TSH: 0.796 u[IU]/mL (ref 0.450–4.500)

## 2022-05-20 LAB — CBC WITH DIFFERENTIAL/PLATELET
Basophils Absolute: 0 10*3/uL (ref 0.0–0.2)
Basos: 1 %
EOS (ABSOLUTE): 0.1 10*3/uL (ref 0.0–0.4)
Eos: 2 %
Hematocrit: 36.9 % (ref 34.0–46.6)
Hemoglobin: 12.3 g/dL (ref 11.1–15.9)
Immature Grans (Abs): 0 10*3/uL (ref 0.0–0.1)
Immature Granulocytes: 0 %
Lymphocytes Absolute: 1.4 10*3/uL (ref 0.7–3.1)
Lymphs: 36 %
MCH: 29.8 pg (ref 26.6–33.0)
MCHC: 33.3 g/dL (ref 31.5–35.7)
MCV: 89 fL (ref 79–97)
Monocytes Absolute: 0.4 10*3/uL (ref 0.1–0.9)
Monocytes: 9 %
Neutrophils Absolute: 2 10*3/uL (ref 1.4–7.0)
Neutrophils: 52 %
Platelets: 207 10*3/uL (ref 150–450)
RBC: 4.13 x10E6/uL (ref 3.77–5.28)
RDW: 11.6 % — ABNORMAL LOW (ref 11.7–15.4)
WBC: 3.9 10*3/uL (ref 3.4–10.8)

## 2022-05-20 LAB — HEMOGLOBIN A1C
Est. average glucose Bld gHb Est-mCnc: 105 mg/dL
Hgb A1c MFr Bld: 5.3 % (ref 4.8–5.6)

## 2022-08-26 ENCOUNTER — Ambulatory Visit
Admission: RE | Admit: 2022-08-26 | Discharge: 2022-08-26 | Disposition: A | Discharge status: Home / Self Care | Payer: 59 | Source: Ambulatory Visit | Attending: Internal Medicine | Admitting: Internal Medicine

## 2022-08-26 DIAGNOSIS — Z1231 Encounter for screening mammogram for malignant neoplasm of breast: Secondary | ICD-10-CM | POA: Insufficient documentation

## 2022-09-12 ENCOUNTER — Ambulatory Visit: Payer: Self-pay | Admitting: *Deleted

## 2022-09-12 NOTE — Telephone Encounter (Signed)
Summary: Swollen ankle   Burning/stinging sensation on the top of the right foot, also ankle swelling. Sometimes it hard to walk. Feels like a stabby firey burn  Best contact: [phone]       Chief Complaint: right ankle swelling , left leg pain Symptoms: right ankle swelling top of foot, stinging , burning sensation. Pain with walking at times. Left leg discomfort from ankle to knee . No swelling left leg.  Frequency: approx 1  month Pertinent Negatives: Patient denies chest pain no difficulty breathing no fever, no redness no itching no inability to walk Disposition: [] ED /[x] Urgent Care (no appt availability in office) / [] Appointment(In office/virtual)/ []  [facility]/ [] Home Care/ [] Refused Recommended Disposition /[] [facility]/ []  Follow-up with PCP Additional Notes:   No available appt with PCP or other providers in practice.  Recommended UC due to sx. Patient would like to see PCP instead of UC. Please advise if PCP ok with appt. Patient would like a call back      Reason for Disposition  [1] Thigh, calf, or ankle swelling AND [2] only 1 side  Answer Assessment - Initial Assessment Questions 1. LOCATION: "Which ankle is swollen?" "Where is the swelling?"     Right ankle  2. ONSET: "When did the swelling start?"     Approx 1 month ago  3. SWELLING: "How bad is the swelling?" Or, "How large is it?" (e.g., mild, moderate, severe; size of localized swelling)    - NONE: No joint swelling.   - LOCALIZED: Localized; small area of puffy or swollen skin (e.g., insect bite, skin irritation).   - MILD: Joint looks or feels mildly swollen or puffy.   - MODERATE: Swollen; interferes with normal activities (e.g., work or school); decreased range of movement; may be limping.   - SEVERE: Very swollen; can't move swollen joint at all; limping a lot or unable to walk.     Mild swelling right ankle  pain to walk at times can walk 4. PAIN: "Is there any pain?" If  Yes, ask: "How bad is it?" (Scale 1-10; or mild, moderate, severe)   - NONE (0): no pain.   - MILD (1-3): doesn't interfere with normal activities.    - MODERATE (4-7): interferes with normal activities (e.g., work or school) or awakens from sleep, limping.    - SEVERE (8-10): excruciating pain, unable to do any normal activities, unable to walk.      Yes , burning , stinging top of foot swollen as well 5. CAUSE: "What do you think caused the ankle swelling?"     Not sure   6. OTHER SYMPTOMS: "Do you have any other symptoms?" (e.g., fever, chest pain, difficulty breathing, calf pain)     Left leg pain throbbing. Right ankle swelling top of foot . No redness no itching  7. PREGNANCY: "Is there any chance you are pregnant?" "When was your last menstrual period?"     na  Protocols used: Ankle Swelling-A-AH

## 2022-09-12 NOTE — Telephone Encounter (Signed)
Noted  UC recommended.  KP

## 2022-09-21 ENCOUNTER — Encounter: Payer: Self-pay | Admitting: Internal Medicine

## 2022-09-23 ENCOUNTER — Ambulatory Visit: Payer: 59 | Admitting: Internal Medicine

## 2022-09-23 ENCOUNTER — Encounter: Payer: Self-pay | Admitting: Internal Medicine

## 2022-09-23 ENCOUNTER — Ambulatory Visit
Admission: RE | Admit: 2022-09-23 | Discharge: 2022-09-23 | Disposition: A | Discharge status: Home / Self Care | Payer: 59 | Source: Ambulatory Visit | Attending: Internal Medicine | Admitting: Internal Medicine

## 2022-09-23 ENCOUNTER — Ambulatory Visit
Admission: RE | Admit: 2022-09-23 | Discharge: 2022-09-23 | Disposition: A | Discharge status: Home / Self Care | Payer: 59 | Attending: Internal Medicine | Admitting: Internal Medicine

## 2022-09-23 VITALS — BP 134/88 | HR 75 | Ht 63.0 in | Wt 163.0 lb

## 2022-09-23 DIAGNOSIS — M25571 Pain in right ankle and joints of right foot: Secondary | ICD-10-CM | POA: Diagnosis not present

## 2022-09-23 DIAGNOSIS — R600 Localized edema: Secondary | ICD-10-CM | POA: Diagnosis not present

## 2022-09-23 DIAGNOSIS — G8929 Other chronic pain: Secondary | ICD-10-CM

## 2022-09-23 NOTE — Progress Notes (Signed)
Date:  09/23/2022   Name:  [name]   DOB:  1965/12/06   MRN:  [id number]   Chief Complaint: Edema (X 6 weeks, Right ankle, burning sensation in foot, comes and goes, no injuries, no redness, stabbing pain, 8 pain scale, hurts to walk on it, elevating foot makes it a little better)  Ankle Pain  Incident onset: no injury - started 6 weeks ago. There was no injury mechanism. The pain is present in the right ankle. The quality of the pain is described as aching and burning. The pain is moderate. The pain has been Fluctuating since onset. Pertinent negatives include no inability to bear weight, loss of sensation, muscle weakness, numbness or tingling.    Lab Results  Component Value Date   NA 140 05/19/2022   K 4.1 05/19/2022   CO2 25 05/19/2022   GLUCOSE 91 05/19/2022   BUN 16 05/19/2022   CREATININE 0.65 05/19/2022   CALCIUM 9.2 05/19/2022   EGFR 104 05/19/2022   GFRNONAA 102 05/11/2020   Lab Results  Component Value Date   CHOL 150 05/19/2022   HDL 66 05/19/2022   LDLCALC 75 05/19/2022   TRIG 39 05/19/2022   CHOLHDL 2.3 05/19/2022   Lab Results  Component Value Date   TSH 0.796 05/19/2022   Lab Results  Component Value Date   HGBA1C 5.3 05/19/2022   Lab Results  Component Value Date   WBC 3.9 05/19/2022   HGB 12.3 05/19/2022   HCT 36.9 05/19/2022   MCV 89 05/19/2022   PLT 207 05/19/2022   Lab Results  Component Value Date   ALT 13 05/19/2022   AST 19 05/19/2022   ALKPHOS 52 05/19/2022   BILITOT 0.5 05/19/2022   No results found for: "25OHVITD2", "25OHVITD3", "VD25OH"   Review of Systems  Constitutional:  Negative for chills, fatigue and fever.  Respiratory:  Negative for chest tightness.   Cardiovascular:  Negative for chest pain and leg swelling.  Musculoskeletal:  Positive for arthralgias, gait problem and joint swelling. Negative for myalgias.  Neurological:  Negative for tingling, syncope, weakness and numbness.    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/11/2020   Tubular adenoma of colon     Allergies  Allergen Reactions   Penicillins     Past Surgical History:  Procedure Laterality Date   COLONOSCOPY WITH PROPOFOL N/A 06/29/2018   Procedure: COLONOSCOPY WITH PROPOFOL;  Surgeon: Pasty Spillers, MD;  Location: [facility] ENDOSCOPY;  Service: Endoscopy;  Laterality: N/A;   TONSILLECTOMY     TOTAL ABDOMINAL HYSTERECTOMY  06/14/2011   Prolapse   TUBAL LIGATION      Social History   Tobacco Use   Smoking status: Never   Smokeless tobacco: Never  Substance Use Topics   Alcohol use: No    Alcohol/week: 0.0 standard drinks of alcohol   Drug use: Never     Medication list has been reviewed and updated.  Current Meds  Medication Sig   cetirizine (ZYRTEC) 10 MG tablet Take 10 mg by mouth daily.   Multiple Vitamin (MULTIVITAMIN) tablet Take 1 tablet by mouth daily.       09/23/2022   11:11 AM 05/19/2022    8:10 AM 05/17/2021    7:58 AM 05/11/2020    7:59 AM  GAD 7 : Generalized Anxiety Score  Nervous, Anxious, on Edge 0 0 0 0  Control/stop worrying 0  0 0 0  Worry too much - different things 0 0 0 0  Trouble relaxing 0 0 0 0  Restless 0 0 0 0  Easily annoyed or irritable 0 0 0 0  Afraid - awful might happen 0 0 0 0  Total GAD 7 Score 0 0 0 0  Anxiety Difficulty Not difficult at all Not difficult at all Not difficult at all Not difficult at all       09/23/2022   11:11 AM 05/19/2022    8:10 AM 05/17/2021    7:58 AM  Depression screen PHQ 2/9  Decreased Interest 0 0 0  Down, Depressed, Hopeless 0 0 0  PHQ - 2 Score 0 0 0  Altered sleeping 1 0 0  Tired, decreased energy 0 0 0  Change in appetite 0 0 0  Feeling bad or failure about yourself  0 0 0  Trouble concentrating 0 0 0  Moving slowly or fidgety/restless 0 0 0  Suicidal thoughts 0 0 0  PHQ-9 Score 1 0 0  Difficult doing work/chores Not difficult at all Not  difficult at all Not difficult at all    BP Readings from Last 3 Encounters:  09/23/22 134/88  05/19/22 132/84  05/17/21 124/84    Physical Exam Vitals and nursing note reviewed.  Constitutional:      General: She is not in acute distress.    Appearance: She is well-developed.  HENT:     Head: Normocephalic and atraumatic.  Pulmonary:     Effort: Pulmonary effort is normal. No respiratory distress.  Musculoskeletal:     Right ankle: Swelling present. Tenderness present over the lateral malleolus. Normal range of motion. Normal pulse.     Right Achilles Tendon: Normal.     Left ankle: Normal.  Skin:    General: Skin is warm and dry.     Findings: No rash.  Neurological:     Mental Status: She is alert and oriented to person, place, and time.  Psychiatric:        Mood and Affect: Mood normal.        Behavior: Behavior normal.     Wt Readings from Last 3 Encounters:  09/23/22 163 lb (73.9 kg)  05/19/22 163 lb (73.9 kg)  05/17/21 169 lb 9.6 oz (76.9 kg)    BP 134/88   Pulse 75   Ht 5\' 3"  (1.6 m)   Wt 163 lb (73.9 kg)   SpO2 98%   BMI 28.87 kg/m   Assessment and Plan:  Problem List Items Addressed This Visit   None Visit Diagnoses     Chronic pain of right ankle    -  Primary   onset 6 wks ago with no injury begin Aleve 2 tabs bid will get imaging to guide next steps if no improvement   Relevant Orders   DG Ankle Complete Right       No follow-ups on file.   Partially dictated using Dragon software, any errors are not intentional.  Reubin Milan, MD [facility] [place], [place]

## 2022-09-23 NOTE — Patient Instructions (Signed)
Start Aleve 2 tablets twice a day.

## 2023-01-16 ENCOUNTER — Ambulatory Visit: Payer: 59 | Admitting: Podiatry

## 2023-01-16 ENCOUNTER — Encounter: Payer: Self-pay | Admitting: Podiatry

## 2023-01-16 DIAGNOSIS — M7672 Peroneal tendinitis, left leg: Secondary | ICD-10-CM

## 2023-01-16 MED ORDER — MELOXICAM 15 MG PO TABS: 15.0000 mg | ORAL_TABLET | Freq: Every day | ORAL | 3 refills | Status: DC

## 2023-01-16 NOTE — Patient Instructions (Signed)

## 2023-01-17 ENCOUNTER — Encounter: Payer: Self-pay | Admitting: Podiatry

## 2023-01-17 NOTE — Progress Notes (Signed)
  Subjective:  Patient ID: [name], female    DOB: [birth year],  MRN: [id number]  Chief Complaint  Patient presents with   Foot Pain    "It's this right foot.  It feels like it's on fire." N - burning sensation L - lateral right D - 6 mos O - off and on C - stinging and burning sensation A - active or inactive T - PCP, xray     57 y.o. female presents with the above complaint. History confirmed with patient.  Worse with activity  Objective:  Physical Exam: warm, good capillary refill, no trophic changes or ulcerative lesions, normal DP and PT pulses, normal sensory exam, and tenderness to the anterior right ankle on the peroneus cordis and tertius tendons as well as towards the insertion of the peroneus brevis the fifth metatarsal base, good 5 out of 5 strength and active and passive range of motion.  Little to no inflammation or edema or tenderness to palpation  Radiographs: Multiple views x-ray of right ankle taken 09/23/2022: no fracture, dislocation, swelling or degenerative changes noted Assessment:   1. Peroneal tendinitis of left lower extremity      Plan:  Patient was evaluated and treated and all questions answered.   Discussed the etiology and treatment options for peroneal tendinitis including stretching, formal physical therapy with an eccentric exercises therapy plan, supportive shoegears such as a running shoe or sneaker, brace, topical and oral medications.  Hopefully should resolve with home physical therapy, discussed if not improving would consider formal physical therapy and corticosteroid injection but seems to be resolving at this point.  -XR reviewed with patient -Educated on stretching and icing of the affected limb. -Rx for Meloxicam. Advised on risks, benefits, and alternatives of the medication   Return if symptoms worsen or fail to improve.

## 2023-03-24 ENCOUNTER — Telehealth: Payer: Self-pay | Admitting: Internal Medicine

## 2023-03-24 NOTE — Telephone Encounter (Signed)
Vernona Rieger calling from [place] is calling to report that the patient received her flu shot on 03/23/2023.

## 2023-03-24 NOTE — Telephone Encounter (Signed)
Added to patient chart.  - [name]

## 2023-05-23 ENCOUNTER — Ambulatory Visit (INDEPENDENT_AMBULATORY_CARE_PROVIDER_SITE_OTHER): Payer: 59 | Admitting: Internal Medicine

## 2023-05-23 ENCOUNTER — Encounter: Payer: Self-pay | Admitting: Internal Medicine

## 2023-05-23 VITALS — BP 140/95 | HR 63 | Ht 63.0 in | Wt 165.0 lb

## 2023-05-23 DIAGNOSIS — Z0000 Encounter for general adult medical examination without abnormal findings: Secondary | ICD-10-CM

## 2023-05-23 DIAGNOSIS — E785 Hyperlipidemia, unspecified: Secondary | ICD-10-CM | POA: Diagnosis not present

## 2023-05-23 DIAGNOSIS — Z131 Encounter for screening for diabetes mellitus: Secondary | ICD-10-CM

## 2023-05-23 DIAGNOSIS — R030 Elevated blood-pressure reading, without diagnosis of hypertension: Secondary | ICD-10-CM | POA: Diagnosis not present

## 2023-05-23 DIAGNOSIS — Z1211 Encounter for screening for malignant neoplasm of colon: Secondary | ICD-10-CM

## 2023-05-23 DIAGNOSIS — Z1231 Encounter for screening mammogram for malignant neoplasm of breast: Secondary | ICD-10-CM

## 2023-05-23 DIAGNOSIS — J069 Acute upper respiratory infection, unspecified: Secondary | ICD-10-CM | POA: Diagnosis not present

## 2023-05-23 NOTE — Assessment & Plan Note (Signed)
Patient to monitor at home and follow up if >130 systolic. Otherwise monitor at intervals and return with home cuff in 4 months

## 2023-05-23 NOTE — Patient Instructions (Addendum)
[employer] or [employer]  BP machines  Check every other day and record.  If more than half the reading are 130 or higher, then make a follow up right away.  If most are less than 130, the continue to monitor about once a week and follow up with the monitor in about 4 months.  Flonase nasal spray - over the counter, generic is fine for nasal congestion.  Call [facility] to schedule your mammogram at [phone].

## 2023-05-23 NOTE — Progress Notes (Signed)
Date:  05/23/2023   Name:  [name]   DOB:  Oct 03, 1965   MRN:  [id number]   Chief Complaint: Annual Exam [name] is a 57 y.o. female who presents today for her Complete Annual Exam. She feels fairly well. She reports exercising some. She reports she is sleeping poorly. Breast complaints - none.  Mammogram: 08/2022 DEXA: none Colonoscopy: 06/2018 repeat 5 yrs - [facility] Pap: d/c'd after TAH  Health Maintenance Due  Topic Date Due   DTaP/Tdap/Td (2 - Td or Tdap) 11/12/2019   COVID-19 Vaccine (4 - 2023-24 season) 02/12/2023   Colonoscopy  06/30/2023    Immunization History  Administered Date(s) Administered   Influenza,inj,quad, With Preservative 04/03/2019   Influenza-Unspecified 03/22/2021, 03/17/2022, 03/23/2023   Moderna Sars-Covid-2 Vaccination 08/17/2019, 09/21/2019, 04/23/2020   Tdap 11/11/2009   Zoster Recombinant(Shingrix) 05/08/2019, 10/14/2019     HPI  Review of Systems  Constitutional:  Negative for chills, fatigue and fever.  HENT:  Negative for congestion and hearing loss.   Eyes:  Negative for visual disturbance.  Respiratory:  Positive for cough (recent URI). Negative for chest tightness, shortness of breath and wheezing.   Cardiovascular:  Negative for chest pain, palpitations and leg swelling.  Gastrointestinal:  Negative for abdominal pain, constipation, diarrhea and vomiting.  Endocrine: Negative for polydipsia and polyuria.  Genitourinary:  Negative for dysuria, frequency and vaginal bleeding.  Musculoskeletal:  Negative for arthralgias, gait problem and joint swelling.  Skin:  Negative for color change and rash.  Neurological:  Negative for dizziness, tremors, light-headedness and headaches.  Hematological:  Negative for adenopathy. Does not bruise/bleed easily.  Psychiatric/Behavioral:  Negative for dysphoric mood and sleep disturbance. The patient is not nervous/anxious.      Lab Results  Component Value Date   NA 140 05/19/2022   K 4.1  05/19/2022   CO2 25 05/19/2022   GLUCOSE 91 05/19/2022   BUN 16 05/19/2022   CREATININE 0.65 05/19/2022   CALCIUM 9.2 05/19/2022   EGFR 104 05/19/2022   GFRNONAA 102 05/11/2020   Lab Results  Component Value Date   CHOL 150 05/19/2022   HDL 66 05/19/2022   LDLCALC 75 05/19/2022   TRIG 39 05/19/2022   CHOLHDL 2.3 05/19/2022   Lab Results  Component Value Date   TSH 0.796 05/19/2022   Lab Results  Component Value Date   HGBA1C 5.3 05/19/2022   Lab Results  Component Value Date   WBC 3.9 05/19/2022   HGB 12.3 05/19/2022   HCT 36.9 05/19/2022   MCV 89 05/19/2022   PLT 207 05/19/2022   Lab Results  Component Value Date   ALT 13 05/19/2022   AST 19 05/19/2022   ALKPHOS 52 05/19/2022   BILITOT 0.5 05/19/2022   No results found for: "25OHVITD2", "25OHVITD3", "VD25OH"   Patient Active Problem List   Diagnosis Date Noted   Elevated BP without diagnosis of hypertension 05/23/2023   Mild hyperlipidemia 05/18/2022   S/P TAH (total abdominal hysterectomy) 05/11/2020   Tubular adenoma of colon     Allergies  Allergen Reactions   Penicillins     Past Surgical History:  Procedure Laterality Date   COLONOSCOPY WITH PROPOFOL N/A 06/29/2018   Procedure: COLONOSCOPY WITH PROPOFOL;  Surgeon: Pasty Spillers, MD;  Location: [facility] ENDOSCOPY;  Service: Endoscopy;  Laterality: N/A;   TONSILLECTOMY     TOTAL ABDOMINAL HYSTERECTOMY  06/14/2011   Prolapse   TUBAL LIGATION      Social History   Tobacco Use  Smoking status: Never   Smokeless tobacco: Never  Substance Use Topics   Alcohol use: No    Alcohol/week: 0.0 standard drinks of alcohol   Drug use: Never     Medication list has been reviewed and updated.  Current Meds  Medication Sig   cetirizine (ZYRTEC) 10 MG tablet Take 10 mg by mouth daily.   ibuprofen (ADVIL) 800 MG tablet Take 800 mg by mouth every 8 (eight) hours as needed.   [DISCONTINUED] meloxicam (MOBIC) 15 MG tablet Take 1 tablet (15 mg  total) by mouth daily.       05/23/2023    8:11 AM 09/23/2022   11:11 AM 05/19/2022    8:10 AM 05/17/2021    7:58 AM  GAD 7 : Generalized Anxiety Score  Nervous, Anxious, on Edge 0 0 0 0  Control/stop worrying 0 0 0 0  Worry too much - different things 0 0 0 0  Trouble relaxing 0 0 0 0  Restless 0 0 0 0  Easily annoyed or irritable 0 0 0 0  Afraid - awful might happen 0 0 0 0  Total GAD 7 Score 0 0 0 0  Anxiety Difficulty Not difficult at all Not difficult at all Not difficult at all Not difficult at all       05/23/2023    8:11 AM 09/23/2022   11:11 AM 05/19/2022    8:10 AM  Depression screen PHQ 2/9  Decreased Interest 0 0 0  Down, Depressed, Hopeless 0 0 0  PHQ - 2 Score 0 0 0  Altered sleeping 0 1 0  Tired, decreased energy 0 0 0  Change in appetite 0 0 0  Feeling bad or failure about yourself  0 0 0  Trouble concentrating 0 0 0  Moving slowly or fidgety/restless 0 0 0  Suicidal thoughts 0 0 0  PHQ-9 Score 0 1 0  Difficult doing work/chores Not difficult at all Not difficult at all Not difficult at all    BP Readings from Last 3 Encounters:  05/23/23 (!) 140/95  09/23/22 134/88  05/19/22 132/84    Physical Exam Vitals and nursing note reviewed.  Constitutional:      General: She is not in acute distress.    Appearance: She is well-developed.  HENT:     Head: Normocephalic and atraumatic.     Right Ear: Tympanic membrane and ear canal normal.     Left Ear: Tympanic membrane and ear canal normal.     Nose:     Right Sinus: No maxillary sinus tenderness or frontal sinus tenderness.     Left Sinus: No maxillary sinus tenderness or frontal sinus tenderness.  Eyes:     General: No scleral icterus.       Right eye: No discharge.        Left eye: No discharge.     Conjunctiva/sclera: Conjunctivae normal.  Neck:     Thyroid: No thyromegaly.     Vascular: No carotid bruit.  Cardiovascular:     Rate and Rhythm: Normal rate and regular rhythm.     Pulses:  Normal pulses.     Heart sounds: Normal heart sounds.  Pulmonary:     Effort: Pulmonary effort is normal. No respiratory distress.     Breath sounds: Normal breath sounds. No decreased breath sounds or wheezing.  Abdominal:     General: Bowel sounds are normal.     Palpations: Abdomen is soft.     Tenderness: There is  no abdominal tenderness.  Musculoskeletal:     Cervical back: Normal range of motion. No erythema.     Right lower leg: No edema.     Left lower leg: No edema.  Lymphadenopathy:     Cervical: No cervical adenopathy.  Skin:    General: Skin is warm and dry.     Findings: No rash.  Neurological:     Mental Status: She is alert and oriented to person, place, and time.     Cranial Nerves: No cranial nerve deficit.     Sensory: No sensory deficit.     Deep Tendon Reflexes: Reflexes are normal and symmetric.  Psychiatric:        Attention and Perception: Attention normal.        Mood and Affect: Mood normal.     Wt Readings from Last 3 Encounters:  05/23/23 165 lb (74.8 kg)  09/23/22 163 lb (73.9 kg)  05/19/22 163 lb (73.9 kg)    BP (!) 140/95 (BP Location: Right Arm, Cuff Size: Large)   Pulse 63   Ht 5\' 3"  (1.6 m)   Wt 165 lb (74.8 kg)   SpO2 100%   BMI 29.23 kg/m   Assessment and Plan:  Problem List Items Addressed This Visit       Unprioritized   Mild hyperlipidemia (Chronic)    Managed with diet. Lab Results  Component Value Date   LDLCALC 75 05/19/2022         Relevant Orders   Lipid panel   Elevated BP without diagnosis of hypertension    Patient to monitor at home and follow up if >130 systolic. Otherwise monitor at intervals and return with home cuff in 4 months      Other Visit Diagnoses     Annual physical exam    -  Primary   Relevant Orders   CBC with Differential/Platelet   Comprehensive metabolic panel   Hemoglobin A1c   Lipid panel   TSH   Encounter for screening mammogram for breast cancer       schedule at [facility]    Relevant Orders   MM 3D SCREENING MAMMOGRAM BILATERAL BREAST   Colon cancer screening       Relevant Orders   Ambulatory referral to Gastroenterology   Screening for diabetes mellitus       Relevant Orders   Comprehensive metabolic panel   Hemoglobin A1c   Viral upper respiratory tract infection       continue Mucinex-DM add Flonase spray no indication for antibiotics at this time       No follow-ups on file.    Reubin Milan, MD [facility] [place]

## 2023-05-23 NOTE — Assessment & Plan Note (Signed)
Managed with diet. Lab Results  Component Value Date   LDLCALC 75 05/19/2022

## 2023-05-24 LAB — COMPREHENSIVE METABOLIC PANEL
ALT: 17 [IU]/L (ref 0–32)
AST: 20 [IU]/L (ref 0–40)
Albumin: 4.4 g/dL (ref 3.8–4.9)
Alkaline Phosphatase: 75 [IU]/L (ref 44–121)
BUN/Creatinine Ratio: 25 — ABNORMAL HIGH (ref 9–23)
BUN: 16 mg/dL (ref 6–24)
Bilirubin Total: 0.3 mg/dL (ref 0.0–1.2)
CO2: 27 mmol/L (ref 20–29)
Calcium: 9.4 mg/dL (ref 8.7–10.2)
Chloride: 102 mmol/L (ref 96–106)
Creatinine, Ser: 0.65 mg/dL (ref 0.57–1.00)
Globulin, Total: 2.7 g/dL (ref 1.5–4.5)
Glucose: 93 mg/dL (ref 70–99)
Potassium: 4.2 mmol/L (ref 3.5–5.2)
Sodium: 141 mmol/L (ref 134–144)
Total Protein: 7.1 g/dL (ref 6.0–8.5)
eGFR: 103 mL/min/{1.73_m2} (ref >59)

## 2023-05-24 LAB — CBC WITH DIFFERENTIAL/PLATELET
Basophils Absolute: 0 10*3/uL (ref 0.0–0.2)
Basos: 0 %
EOS (ABSOLUTE): 0.2 10*3/uL (ref 0.0–0.4)
Eos: 4 %
Hematocrit: 39 % (ref 34.0–46.6)
Hemoglobin: 12.6 g/dL (ref 11.1–15.9)
Immature Grans (Abs): 0 10*3/uL (ref 0.0–0.1)
Immature Granulocytes: 0 %
Lymphocytes Absolute: 1.8 10*3/uL (ref 0.7–3.1)
Lymphs: 35 %
MCH: 29.4 pg (ref 26.6–33.0)
MCHC: 32.3 g/dL (ref 31.5–35.7)
MCV: 91 fL (ref 79–97)
Monocytes Absolute: 0.4 10*3/uL (ref 0.1–0.9)
Monocytes: 8 %
Neutrophils Absolute: 2.7 10*3/uL (ref 1.4–7.0)
Neutrophils: 53 %
Platelets: 223 10*3/uL (ref 150–450)
RBC: 4.28 x10E6/uL (ref 3.77–5.28)
RDW: 11.3 % — ABNORMAL LOW (ref 11.7–15.4)
WBC: 5.1 10*3/uL (ref 3.4–10.8)

## 2023-05-24 LAB — HEMOGLOBIN A1C
Est. average glucose Bld gHb Est-mCnc: 108 mg/dL
Hgb A1c MFr Bld: 5.4 % (ref 4.8–5.6)

## 2023-05-24 LAB — LIPID PANEL
Chol/HDL Ratio: 2.4 {ratio} (ref 0.0–4.4)
Cholesterol, Total: 165 mg/dL (ref 100–199)
HDL: 70 mg/dL (ref >39)
LDL Chol Calc (NIH): 86 mg/dL (ref 0–99)
Triglycerides: 38 mg/dL (ref 0–149)
VLDL Cholesterol Cal: 9 mg/dL (ref 5–40)

## 2023-05-24 LAB — TSH: TSH: 0.875 u[IU]/mL (ref 0.450–4.500)

## 2023-05-29 ENCOUNTER — Encounter: Payer: Self-pay | Admitting: *Deleted

## 2023-06-12 ENCOUNTER — Other Ambulatory Visit: Payer: Self-pay | Admitting: *Deleted

## 2023-06-12 ENCOUNTER — Telehealth: Payer: Self-pay

## 2023-06-12 ENCOUNTER — Telehealth: Payer: Self-pay | Admitting: *Deleted

## 2023-06-12 DIAGNOSIS — Z860100 Personal history of colon polyps, unspecified: Secondary | ICD-10-CM

## 2023-06-12 MED ORDER — NA SULFATE-K SULFATE-MG SULF 17.5-3.13-1.6 GM/177ML PO SOLN: 1.0000 | Freq: Once | ORAL | 0 refills | Status: AC

## 2023-06-12 NOTE — Telephone Encounter (Addendum)
Gastroenterology Pre-Procedure Review  Request Date: 07/31/2023 Requesting Physician: Dr. Allegra Lai  PATIENT REVIEW QUESTIONS: The patient responded to the following health history questions as indicated:    1. Are you having any GI issues? no 2. Do you have a personal history of Polyps? yes (last colonoscopy on 06/29/2018 with Dr Maximino Greenland) 3. Do you have a family history of Colon Cancer or Polyps? no 4. Diabetes Mellitus? no 5. Joint replacements in the past 12 months?no 6. Major health problems in the past 3 months?no 7. Any artificial heart valves, MVP, or defibrillator?no    MEDICATIONS & ALLERGIES:    Patient reports the following regarding taking any anticoagulation/antiplatelet therapy:   Plavix, Coumadin, Eliquis, Xarelto, Lovenox, Pradaxa, Brilinta, or Effient? no Aspirin? no  Patient confirms/reports the following medications:  Current Outpatient Medications  Medication Sig Dispense Refill   Na Sulfate-K Sulfate-Mg Sulf 17.5-3.13-1.6 GM/177ML SOLN Take 1 kit by mouth once for 1 dose. 354 mL 0   cetirizine (ZYRTEC) 10 MG tablet Take 10 mg by mouth daily.     ibuprofen (ADVIL) 800 MG tablet Take 800 mg by mouth every 8 (eight) hours as needed.     No current facility-administered medications for this visit.    Patient confirms/reports the following allergies:  Allergies  Allergen Reactions   Penicillins     No orders of the defined types were placed in this encounter.   AUTHORIZATION INFORMATION Primary Insurance: 1D#: Group #:  Secondary Insurance: 1D#: Group #:  SCHEDULE INFORMATION: Date: 07/31/2023 Time: Location:  [facility]

## 2023-06-12 NOTE — Telephone Encounter (Signed)
The patient called in and left a voicemail to schedule colonoscopy. I called the patient back to let her know we got her message and no answer I sent the message to the scheduler.

## 2023-06-12 NOTE — Telephone Encounter (Signed)
Colonoscopy schedule on 07/31/2023 with Dr Allegra Lai at [facility]

## 2023-06-12 NOTE — Telephone Encounter (Signed)
This is a recall colonoscopy

## 2023-07-24 ENCOUNTER — Encounter: Payer: Self-pay | Admitting: Gastroenterology

## 2023-07-27 ENCOUNTER — Telehealth: Payer: Self-pay

## 2023-07-27 NOTE — Telephone Encounter (Signed)
Patient states she called her insurance company and they told her it would not be 1700 dollars and she states she will keep the procedure as schedule

## 2023-07-27 NOTE — Telephone Encounter (Signed)
Patient is calling to cancel her colonoscopy because she got a call from [employer] that she was going to have to pay 1700. She states that was more expensive then she was expecting. Inform patient that [employer] call everyone and tells them there estimate price and tell them they have to pay there amount the day of the procedure. Informed her they do not know the price because the provider has not perform the procedure yet and found anything. Also informed her she did not have to pay anything the day of the procedure and she can make payment plans after the procedure is done when she receives a bill. She asked if her insurance would cover the procedure. Informed her to call her insurance and tell them she had a colonoscopy done back on 06/2018 and had a polyp removed. It was recommended to have a repeat one in 5 years. Gave her the diagnosis code and CPT code. She is going to call her insurance company now. She asked if she is going to get charge for canceling if she does not cancel now. Informed her no she is not going to get charge

## 2023-07-31 ENCOUNTER — Ambulatory Visit
Admission: RE | Admit: 2023-07-31 | Discharge: 2023-07-31 | Disposition: A | Discharge status: Home / Self Care | Payer: 59 | Attending: Gastroenterology | Admitting: Gastroenterology

## 2023-07-31 ENCOUNTER — Ambulatory Visit: Payer: 59 | Admitting: Anesthesiology

## 2023-07-31 ENCOUNTER — Encounter
Admission: RE | Disposition: A | Discharge status: Home / Self Care | Payer: Self-pay | Source: Home / Self Care | Attending: Gastroenterology

## 2023-07-31 DIAGNOSIS — K635 Polyp of colon: Secondary | ICD-10-CM | POA: Diagnosis not present

## 2023-07-31 DIAGNOSIS — D124 Benign neoplasm of descending colon: Secondary | ICD-10-CM | POA: Diagnosis not present

## 2023-07-31 DIAGNOSIS — Z1211 Encounter for screening for malignant neoplasm of colon: Secondary | ICD-10-CM | POA: Diagnosis not present

## 2023-07-31 DIAGNOSIS — Z860100 Personal history of colon polyps, unspecified: Secondary | ICD-10-CM | POA: Diagnosis not present

## 2023-07-31 HISTORY — PX: POLYPECTOMY: SHX5525

## 2023-07-31 HISTORY — PX: COLONOSCOPY WITH PROPOFOL: SHX5780

## 2023-07-31 SURGERY — COLONOSCOPY WITH PROPOFOL
Anesthesia: General

## 2023-07-31 MED ORDER — PROPOFOL 10 MG/ML IV BOLUS
INTRAVENOUS | Status: DC | PRN
  Administered 2023-07-31: 80 mg via INTRAVENOUS

## 2023-07-31 MED ORDER — PROPOFOL 500 MG/50ML IV EMUL
INTRAVENOUS | Status: DC | PRN
  Administered 2023-07-31: 125 ug/kg/min via INTRAVENOUS

## 2023-07-31 MED ORDER — FENTANYL CITRATE (PF) 100 MCG/2ML IJ SOLN
INTRAMUSCULAR | Status: AC
  Filled 2023-07-31: qty 2

## 2023-07-31 MED ORDER — FENTANYL CITRATE (PF) 100 MCG/2ML IJ SOLN
INTRAMUSCULAR | Status: DC | PRN
  Administered 2023-07-31: 25 ug via INTRAVENOUS
  Administered 2023-07-31: 50 ug via INTRAVENOUS
  Administered 2023-07-31: 25 ug via INTRAVENOUS

## 2023-07-31 MED ORDER — SODIUM CHLORIDE 0.9 % IV SOLN
INTRAVENOUS | Status: DC
  Administered 2023-07-31: 20 mL/h via INTRAVENOUS

## 2023-07-31 MED ORDER — EPHEDRINE SULFATE-NACL 50-0.9 MG/10ML-% IV SOSY
PREFILLED_SYRINGE | INTRAVENOUS | Status: DC | PRN
  Administered 2023-07-31: 10 mg via INTRAVENOUS

## 2023-07-31 NOTE — Anesthesia Preprocedure Evaluation (Signed)
Anesthesia Evaluation  Patient identified by MRN, date of birth, ID band Patient awake    Reviewed: Allergy & Precautions, NPO status , Patient's Chart, lab work & pertinent test results  History of Anesthesia Complications Negative for: history of anesthetic complications  Airway Mallampati: II  TM Distance: >3 FB Neck ROM: full    Dental no notable dental hx.    Pulmonary neg pulmonary ROS   Pulmonary exam normal        Cardiovascular negative cardio ROS Normal cardiovascular exam     Neuro/Psych negative neurological ROS  negative psych ROS   GI/Hepatic negative GI ROS, Neg liver ROS,,,  Endo/Other  negative endocrine ROS    Renal/GU negative Renal ROS  negative genitourinary   Musculoskeletal   Abdominal   Peds  Hematology negative hematology ROS (+)   Anesthesia Other Findings History reviewed. No pertinent past medical history.  Past Surgical History: 06/29/2018: COLONOSCOPY WITH PROPOFOL; N/A     Comment:  Procedure: COLONOSCOPY WITH PROPOFOL;  Surgeon:               Pasty Spillers, MD;  Location: [facility] ENDOSCOPY;                Service: Endoscopy;  Laterality: N/A; No date: TONSILLECTOMY 06/14/2011: TOTAL ABDOMINAL HYSTERECTOMY     Comment:  Prolapse No date: TUBAL LIGATION  BMI    Body Mass Index: 28.63 kg/m      Reproductive/Obstetrics negative OB ROS                             Anesthesia Physical Anesthesia Plan  ASA: 1  Anesthesia Plan: General   Post-op Pain Management: Minimal or no pain anticipated   Induction: Intravenous  PONV Risk Score and Plan: 2 and Propofol infusion and TIVA  Airway Management Planned: Natural Airway and Nasal Cannula  Additional Equipment:   Intra-op Plan:   Post-operative Plan:   Informed Consent: I have reviewed the patients History and Physical, chart, labs and discussed the procedure including the risks,  benefits and alternatives for the proposed anesthesia with the patient or authorized representative who has indicated his/her understanding and acceptance.     Dental Advisory Given  Plan Discussed with: Anesthesiologist, CRNA and Surgeon  Anesthesia Plan Comments: (Patient consented for risks of anesthesia including but not limited to:  - adverse reactions to medications - risk of airway placement if required - damage to eyes, teeth, lips or other oral mucosa - nerve damage due to positioning  - sore throat or hoarseness - Damage to heart, brain, nerves, lungs, other parts of body or loss of life  Patient voiced understanding and assent.)       Anesthesia Quick Evaluation

## 2023-07-31 NOTE — Op Note (Signed)
[facility] Gastroenterology Patient Name: [name] Procedure Date: 07/31/2023 8:59 AM MRN: [id number] Account #: [id number] Date of Birth: 1965-09-23 Admit Type: Outpatient Age: 58 Room: [facility] ENDO ROOM 2 Gender: Female Note Status: Finalized Instrument Name: Prentice Docker [id number] Procedure:             Colonoscopy Indications:           Surveillance: Personal history of adenomatous polyps                         on last colonoscopy 5 years ago, Last colonoscopy:                         January 2020 Providers:             Toney Reil MD, MD Referring MD:          Bari Edward, MD (Referring MD) Medicines:             General Anesthesia Complications:         No immediate complications. Estimated blood loss: None. Procedure:             Pre-Anesthesia Assessment:                        - Prior to the procedure, a History and Physical was                         performed, and patient medications and allergies were                         reviewed. The patient is competent. The risks and                         benefits of the procedure and the sedation options and                         risks were discussed with the patient. All questions                         were answered and informed consent was obtained.                         Patient identification and proposed procedure were                         verified by the physician, the nurse, the                         anesthesiologist, the anesthetist and the technician                         in the pre-procedure area in the procedure room in the                         endoscopy suite. Mental Status Examination: alert and                         oriented. Airway Examination: normal oropharyngeal  airway and neck mobility. Respiratory Examination:                         clear to auscultation. CV Examination: normal.                         Prophylactic Antibiotics: The patient  does not require                         prophylactic antibiotics. Prior Anticoagulants: The                         patient has taken no anticoagulant or antiplatelet                         agents. ASA Grade Assessment: I - A normal, healthy                         patient. After reviewing the risks and benefits, the                         patient was deemed in satisfactory condition to                         undergo the procedure. The anesthesia plan was to use                         general anesthesia. Immediately prior to                         administration of medications, the patient was                         re-assessed for adequacy to receive sedatives. The                         heart rate, respiratory rate, oxygen saturations,                         blood pressure, adequacy of pulmonary ventilation, and                         response to care were monitored throughout the                         procedure. The physical status of the patient was                         re-assessed after the procedure.                        After obtaining informed consent, the colonoscope was                         passed under direct vision. Throughout the procedure,                         the patient's blood pressure, pulse, and oxygen  saturations were monitored continuously. The                         Colonoscope was introduced through the anus and                         advanced to the the cecum, identified by appendiceal                         orifice and ileocecal valve. The colonoscopy was                         performed without difficulty. The patient tolerated                         the procedure well. The quality of the bowel                         preparation was evaluated using the BBPS [facility] Bowel                         Preparation Scale) with scores of: Right Colon = 3,                         Transverse Colon = 3 and Left Colon = 3  (entire mucosa                         seen well with no residual staining, small fragments                         of stool or opaque liquid). The total BBPS score                         equals 9. The terminal ileum, ileocecal valve,                         appendiceal orifice, and rectum were photographed. Findings:      The perianal and digital rectal examinations were normal. Pertinent       negatives include normal sphincter tone and no palpable rectal lesions.      A diminutive polyp was found in the descending colon. The polyp was       sessile. The polyp was removed with a jumbo cold forceps. Resection and       retrieval were complete.      The retroflexed view of the distal rectum and anal verge was normal and       showed no anal or rectal abnormalities. Impression:            - One diminutive polyp in the descending colon,                         removed with a jumbo cold forceps. Resected and                         retrieved.                        - The distal rectum and anal verge  are normal on                         retroflexion view. Recommendation:        - Discharge patient to home (with escort).                        - Resume previous diet today.                        - Continue present medications.                        - Await pathology results.                        - Repeat colonoscopy in 5 to 7 years for surveillance                         based on pathology results. Procedure Code(s):     --- Professional ---                        [phone], Colonoscopy, flexible; with biopsy, single or                         multiple Diagnosis Code(s):     --- Professional ---                        Z86.010, Personal history of colonic polyps                        D12.4, Benign neoplasm of descending colon CPT copyright 2022 American Medical Association. All rights reserved. The codes documented in this report are preliminary and upon coder review may  be revised to  meet current compliance requirements. Dr. Libby Maw Toney Reil MD, MD 07/31/2023 9:29:21 AM This report has been signed electronically. Number of Addenda: 0 Note Initiated On: 07/31/2023 8:59 AM Scope Withdrawal Time: 0 hours 9 minutes 57 seconds  Total Procedure Duration: 0 hours 12 minutes 21 seconds  Estimated Blood Loss:  Estimated blood loss: none.      [facility]

## 2023-07-31 NOTE — H&P (Signed)
[name], MD [address]  [address]  [place], [place] [address]  Main: [phone]  Fax: [phone] Pager: [phone]  Primary Care Physician:  Reubin Milan, MD Primary Gastroenterologist:  Dr. [name]  Pre-Procedure History & Physical: HPI:  [name] is a 58 y.o. female is here for an colonoscopy.   History reviewed. No pertinent past medical history.  Past Surgical History:  Procedure Laterality Date   COLONOSCOPY WITH PROPOFOL N/A 06/29/2018   Procedure: COLONOSCOPY WITH PROPOFOL;  Surgeon: Pasty Spillers, MD;  Location: [facility] ENDOSCOPY;  Service: Endoscopy;  Laterality: N/A;   TONSILLECTOMY     TOTAL ABDOMINAL HYSTERECTOMY  06/14/2011   Prolapse   TUBAL LIGATION      Prior to Admission medications   Medication Sig Start Date End Date Taking? Authorizing Provider  cetirizine (ZYRTEC) 10 MG tablet Take 10 mg by mouth daily.   Yes [provider]  ibuprofen (ADVIL) 800 MG tablet Take 800 mg by mouth every 8 (eight) hours as needed.   Yes [provider]    Allergies as of 06/12/2023 - Review Complete 05/23/2023  Allergen Reaction Noted   Penicillins  03/29/2015    Family History  Problem Relation Age of Onset   Hypertension Father    Melanoma Father    Colon polyps Father    Osteoporosis Mother    Colon cancer Paternal Grandmother    Breast cancer Neg Hx     Social History   Socioeconomic History   Marital status: Married    Spouse name: Not on file   Number of children: 3   Years of education: Not on file   Highest education level: Not on file  Occupational History   Not on file  Tobacco Use   Smoking status: Never   Smokeless tobacco: Never  Substance and Sexual Activity   Alcohol use: No    Alcohol/week: 0.0 standard drinks of alcohol   Drug use: Never   Sexual activity: Not on file  Other Topics Concern   Not on file  Social History Narrative   Not on file   Social Drivers of Health    Financial Resource Strain: Low Risk  (09/23/2022)   Overall Financial Resource Strain (CARDIA)    Difficulty of Paying Living Expenses: Not hard at all  Food Insecurity: No Food Insecurity (09/23/2022)   Hunger Vital Sign    Worried About Running Out of Food in the Last Year: Never true    Ran Out of Food in the Last Year: Never true  Transportation Needs: No Transportation Needs (09/23/2022)   PRAPARE - Administrator, Civil Service (Medical): No    Lack of Transportation (Non-Medical): No  Physical Activity: Sufficiently Active (04/28/2017)   Exercise Vital Sign    Days of Exercise per Week: 5 days    Minutes of Exercise per Session: 60 min  Stress: No Stress Concern Present (04/28/2017)   [employer] of Occupational Health - Occupational Stress Questionnaire    Feeling of Stress : Not at all  Social Connections: Moderately Integrated (04/28/2017)   Social Connection and Isolation Panel [NHANES]    Frequency of Communication with Friends and Family: More than three times a week    Frequency of Social Gatherings with Friends and Family: Three times a week    Attends Religious Services: More than 4 times per year    Active Member of Clubs or Organizations: No    Attends Banker  Meetings: Never    Marital Status: Married  Catering manager Violence: Not At Risk (09/23/2022)   Humiliation, Afraid, Rape, and Kick questionnaire    Fear of Current or Ex-Partner: No    Emotionally Abused: No    Physically Abused: No    Sexually Abused: No    Review of Systems: See HPI, otherwise negative ROS  Physical Exam: BP 134/83   Pulse 63   Temp (!) 96.6 F (35.9 C) (Temporal)   Resp 20   Ht 5\' 3"  (1.6 m)   Wt 73.3 kg   SpO2 99%   BMI 28.63 kg/m  General:   Alert,  pleasant and cooperative in NAD Head:  Normocephalic and atraumatic. Neck:  Supple; no masses or thyromegaly. Lungs:  Clear throughout to auscultation.    Heart:  Regular rate and  rhythm. Abdomen:  Soft, nontender and nondistended. Normal bowel sounds, without guarding, and without rebound.   Neurologic:  Alert and  oriented x4;  grossly normal neurologically.  Impression/Plan: [name] is here for an colonoscopy to be performed for h/o colon adenomas  Risks, benefits, limitations, and alternatives regarding  colonoscopy have been reviewed with the patient.  Questions have been answered.  All parties agreeable.   Lannette Donath, MD  07/31/2023, 8:25 AM

## 2023-07-31 NOTE — Anesthesia Postprocedure Evaluation (Signed)
Anesthesia Post Note  Patient: [name]  Procedure(s) Performed: COLONOSCOPY WITH PROPOFOL POLYPECTOMY  Patient location during evaluation: Endoscopy Anesthesia Type: General Level of consciousness: awake and alert Pain management: pain level controlled Vital Signs Assessment: post-procedure vital signs reviewed and stable Respiratory status: spontaneous breathing, nonlabored ventilation, respiratory function stable and patient connected to nasal cannula oxygen Cardiovascular status: blood pressure returned to baseline and stable Postop Assessment: no apparent nausea or vomiting Anesthetic complications: no   No notable events documented.   Last Vitals:  Vitals:   07/31/23 0943 07/31/23 0956  BP: 114/72 126/71  Pulse: 65 61  Resp: 15 12  Temp:    SpO2: 100% 100%    Last Pain:  Vitals:   07/31/23 0943  TempSrc:   PainSc: 0-No pain                 [name]

## 2023-07-31 NOTE — Transfer of Care (Signed)
Immediate Anesthesia Transfer of Care Note  Patient: [name]  Procedure(s) Performed: COLONOSCOPY WITH PROPOFOL POLYPECTOMY  Patient Location: PACU  Anesthesia Type:General  Level of Consciousness: awake  Airway & Oxygen Therapy: Patient Spontanous Breathing and Patient connected to nasal cannula oxygen  Post-op Assessment: Report given to RN, Post -op Vital signs reviewed and stable, and Patient moving all extremities  Post vital signs: Reviewed and stable  Last Vitals:  Vitals Value Taken Time  BP 102/52 07/31/23 0933  Temp 35.4 C 07/31/23 0932  Pulse 60 07/31/23 0934  Resp 13 07/31/23 0934  SpO2 100 % 07/31/23 0934  Vitals shown include unfiled device data.  Last Pain:  Vitals:   07/31/23 0932  TempSrc: Temporal  PainSc: Asleep         Complications: No notable events documented.

## 2023-08-01 ENCOUNTER — Encounter: Payer: Self-pay | Admitting: Gastroenterology

## 2023-08-01 LAB — SURGICAL PATHOLOGY

## 2023-08-28 ENCOUNTER — Ambulatory Visit
Admission: RE | Admit: 2023-08-28 | Discharge: 2023-08-28 | Disposition: A | Discharge status: Home / Self Care | Payer: 59 | Source: Ambulatory Visit | Attending: Internal Medicine | Admitting: Internal Medicine

## 2023-08-28 DIAGNOSIS — Z1231 Encounter for screening mammogram for malignant neoplasm of breast: Secondary | ICD-10-CM | POA: Diagnosis not present

## 2023-10-04 ENCOUNTER — Ambulatory Visit: Admitting: Podiatry

## 2023-10-04 ENCOUNTER — Encounter: Payer: Self-pay | Admitting: Podiatry

## 2023-10-04 VITALS — Ht 63.0 in | Wt 161.6 lb

## 2023-10-04 DIAGNOSIS — M722 Plantar fascial fibromatosis: Secondary | ICD-10-CM

## 2023-10-04 NOTE — Patient Instructions (Signed)
 Look for power step, Superfeet or pro talus (ProTalus t100 first gen is on sale right now for $15)   Plantar Fasciitis (Heel Spur Syndrome) with Rehab The plantar fascia is a fibrous, ligament-like, soft-tissue structure that spans the bottom of the foot. Plantar fasciitis is a condition that causes pain in the foot due to inflammation of the tissue. SYMPTOMS  Pain and tenderness on the underneath side of the foot. Pain that worsens with standing or walking. CAUSES  Plantar fasciitis is caused by irritation and injury to the plantar fascia on the underneath side of the foot. Common mechanisms of injury include: Direct trauma to bottom of the foot. Damage to a small nerve that runs under the foot where the main fascia attaches to the heel bone. Stress placed on the plantar fascia due to bone spurs. RISK INCREASES WITH:  Activities that place stress on the plantar fascia (running, jumping, pivoting, or cutting). Poor strength and flexibility. Improperly fitted shoes. Tight calf muscles. Flat feet. Failure to warm-up properly before activity. Obesity. PREVENTION Warm up and stretch properly before activity. Allow for adequate recovery between workouts. Maintain physical fitness: Strength, flexibility, and endurance. Cardiovascular fitness. Maintain a health body weight. Avoid stress on the plantar fascia. Wear properly fitted shoes, including arch supports for individuals who have flat feet.  PROGNOSIS  If treated properly, then the symptoms of plantar fasciitis usually resolve without surgery. However, occasionally surgery is necessary.  RELATED COMPLICATIONS  Recurrent symptoms that may result in a chronic condition. Problems of the lower back that are caused by compensating for the injury, such as limping. Pain or weakness of the foot during push-off following surgery. Chronic inflammation, scarring, and partial or complete fascia tear, occurring more often from repeated  injections.  TREATMENT  Treatment initially involves the use of ice and medication to help reduce pain and inflammation. The use of strengthening and stretching exercises may help reduce pain with activity, especially stretches of the Achilles tendon. These exercises may be performed at home or with a therapist. Your caregiver may recommend that you use heel cups of arch supports to help reduce stress on the plantar fascia. Occasionally, corticosteroid injections are given to reduce inflammation. If symptoms persist for greater than 6 months despite non-surgical (conservative), then surgery may be recommended.   MEDICATION  If pain medication is necessary, then nonsteroidal anti-inflammatory medications, such as aspirin and ibuprofen, or other minor pain relievers, such as acetaminophen, are often recommended. Do not take pain medication within 7 days before surgery. Prescription pain relievers may be given if deemed necessary by your caregiver. Use only as directed and only as much as you need. Corticosteroid injections may be given by your caregiver. These injections should be reserved for the most serious cases, because they may only be given a certain number of times.  HEAT AND COLD Cold treatment (icing) relieves pain and reduces inflammation. Cold treatment should be applied for 10 to 15 minutes every 2 to 3 hours for inflammation and pain and immediately after any activity that aggravates your symptoms. Use ice packs or massage the area with a piece of ice (ice massage). Heat treatment may be used prior to performing the stretching and strengthening activities prescribed by your caregiver, physical therapist, or athletic trainer. Use a heat pack or soak the injury in warm water.  SEEK IMMEDIATE MEDICAL CARE IF: Treatment seems to offer no benefit, or the condition worsens. Any medications produce adverse side effects.  EXERCISES- RANGE OF MOTION (ROM) AND  STRETCHING EXERCISES - Plantar  Fasciitis (Heel Spur Syndrome) These exercises may help you when beginning to rehabilitate your injury. Your symptoms may resolve with or without further involvement from your physician, physical therapist or athletic trainer. While completing these exercises, remember:  Restoring tissue flexibility helps normal motion to return to the joints. This allows healthier, less painful movement and activity. An effective stretch should be held for at least 30 seconds. A stretch should never be painful. You should only feel a gentle lengthening or release in the stretched tissue.  RANGE OF MOTION - Toe Extension, Flexion Sit with your right / left leg crossed over your opposite knee. Grasp your toes and gently pull them back toward the top of your foot. You should feel a stretch on the bottom of your toes and/or foot. Hold this stretch for 10 seconds. Now, gently pull your toes toward the bottom of your foot. You should feel a stretch on the top of your toes and or foot. Hold this stretch for 10 seconds. Repeat  times. Complete this stretch 3 times per day.   RANGE OF MOTION - Ankle Dorsiflexion, Active Assisted Remove shoes and sit on a chair that is preferably not on a carpeted surface. Place right / left foot under knee. Extend your opposite leg for support. Keeping your heel down, slide your right / left foot back toward the chair until you feel a stretch at your ankle or calf. If you do not feel a stretch, slide your bottom forward to the edge of the chair, while still keeping your heel down. Hold this stretch for 10 seconds. Repeat 3 times. Complete this stretch 2 times per day.   STRETCH  Gastroc, Standing Place hands on wall. Extend right / left leg, keeping the front knee somewhat bent. Slightly point your toes inward on your back foot. Keeping your right / left heel on the floor and your knee straight, shift your weight toward the wall, not allowing your back to arch. You should feel a  gentle stretch in the right / left calf. Hold this position for 10 seconds. Repeat 3 times. Complete this stretch 2 times per day.  STRETCH  Soleus, Standing Place hands on wall. Extend right / left leg, keeping the other knee somewhat bent. Slightly point your toes inward on your back foot. Keep your right / left heel on the floor, bend your back knee, and slightly shift your weight over the back leg so that you feel a gentle stretch deep in your back calf. Hold this position for 10 seconds. Repeat 3 times. Complete this stretch 2 times per day.  STRETCH  Gastrocsoleus, Standing  Note: This exercise can place a lot of stress on your foot and ankle. Please complete this exercise only if specifically instructed by your caregiver.  Place the ball of your right / left foot on a step, keeping your other foot firmly on the same step. Hold on to the wall or a rail for balance. Slowly lift your other foot, allowing your body weight to press your heel down over the edge of the step. You should feel a stretch in your right / left calf. Hold this position for 10 seconds. Repeat this exercise with a slight bend in your right / left knee. Repeat 3 times. Complete this stretch 2 times per day.   STRENGTHENING EXERCISES - Plantar Fasciitis (Heel Spur Syndrome)  These exercises may help you when beginning to rehabilitate your injury. They may resolve your symptoms  with or without further involvement from your physician, physical therapist or athletic trainer. While completing these exercises, remember:  Muscles can gain both the endurance and the strength needed for everyday activities through controlled exercises. Complete these exercises as instructed by your physician, physical therapist or athletic trainer. Progress the resistance and repetitions only as guided.  STRENGTH - Towel Curls Sit in a chair positioned on a non-carpeted surface. Place your foot on a towel, keeping your heel on the  floor. Pull the towel toward your heel by only curling your toes. Keep your heel on the floor. Repeat 3 times. Complete this exercise 2 times per day.  STRENGTH - Ankle Inversion Secure one end of a rubber exercise band/tubing to a fixed object (table, pole). Loop the other end around your foot just before your toes. Place your fists between your knees. This will focus your strengthening at your ankle. Slowly, pull your big toe up and in, making sure the band/tubing is positioned to resist the entire motion. Hold this position for 10 seconds. Have your muscles resist the band/tubing as it slowly pulls your foot back to the starting position. Repeat 3 times. Complete this exercises 2 times per day.  Document Released: 05/30/2005 Document Revised: 08/22/2011 Document Reviewed: 09/11/2008 [facility] Patient Information 2014 [place], [place].

## 2023-10-05 NOTE — Progress Notes (Signed)
  Subjective:  Patient ID: [name], female    DOB: [birth year],  MRN: [id number]  Chief Complaint  Patient presents with   Foot Pain    Pt is here due to right heel pain, which started 6 weeks ago, no injury to foot, pt states its hard to walk on sometimes.    58 y.o. female presents with the above complaint. History confirmed with patient.  The peroneal tendinitis resolved uneventfully.  She has taken a couple meloxicam  for this new issue  Objective:  Physical Exam: warm, good capillary refill, no trophic changes or ulcerative lesions, normal DP and PT pulses, normal sensory exam, and tenderness plantar medial band of the plantar fascia at its insertion on the calcaneus Radiographs: Multiple views x-ray of right ankle taken 09/23/2022: no fracture, dislocation, swelling or degenerative changes noted Assessment:   1. Plantar fasciitis, right      Plan:  Patient was evaluated and treated and all questions answered.  Discussed the etiology and treatment options for plantar fasciitis including stretching, formal physical therapy, supportive shoegears such as a running shoe or sneaker, pre fabricated orthoses, injection therapy, and oral medications. We also discussed the role of surgical treatment of this for patients who do not improve after exhausting non-surgical treatment options.   -Educated patient on stretching and icing of the affected limb -Injection delivered to the plantar fascia of the right foot. -Rx for meloxicam  was sent last fall she did not take much of this at that time, she will let me know if she needs a refill and will take it for at least 3 to 4 weeks. Educated on use, risks and benefits of the medication    Return if symptoms worsen or fail to improve.

## 2023-11-04 IMAGING — MG MM DIGITAL SCREENING BILAT W/ TOMO AND CAD
8 series · 9 of 24 positions shown · non-contrast
Comparison: Previous exam(s).

CLINICAL DATA: Screening.

EXAM:
DIGITAL SCREENING BILATERAL MAMMOGRAM WITH TOMOSYNTHESIS AND CAD
TECHNIQUE: Bilateral screening digital craniocaudal and mediolateral oblique
mammograms were obtained. Bilateral screening digital breast
tomosynthesis was performed. The images were evaluated with
computer-aided detection.

[R MLO synth-2D]
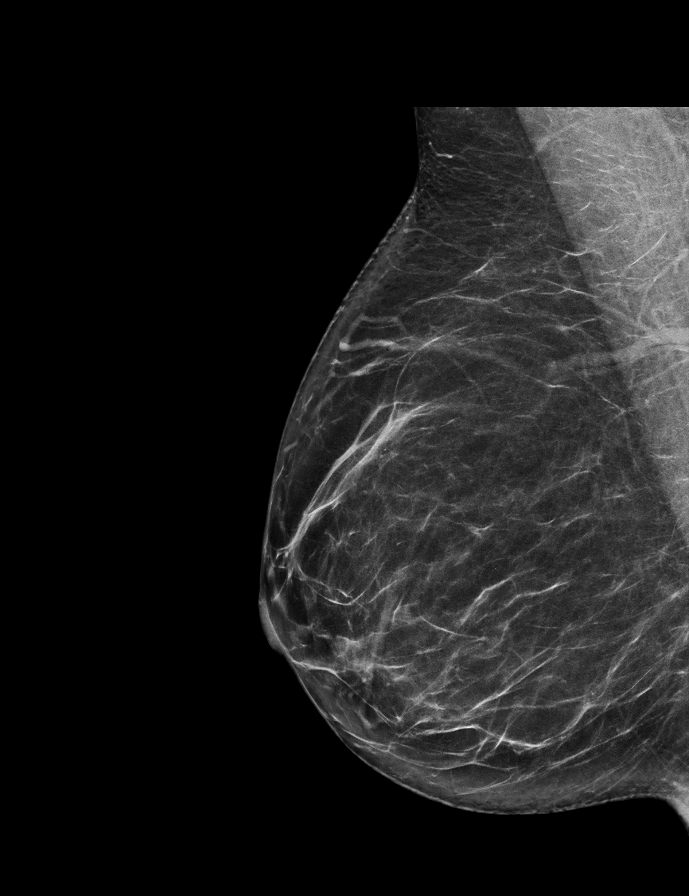

[R CC synth-2D]
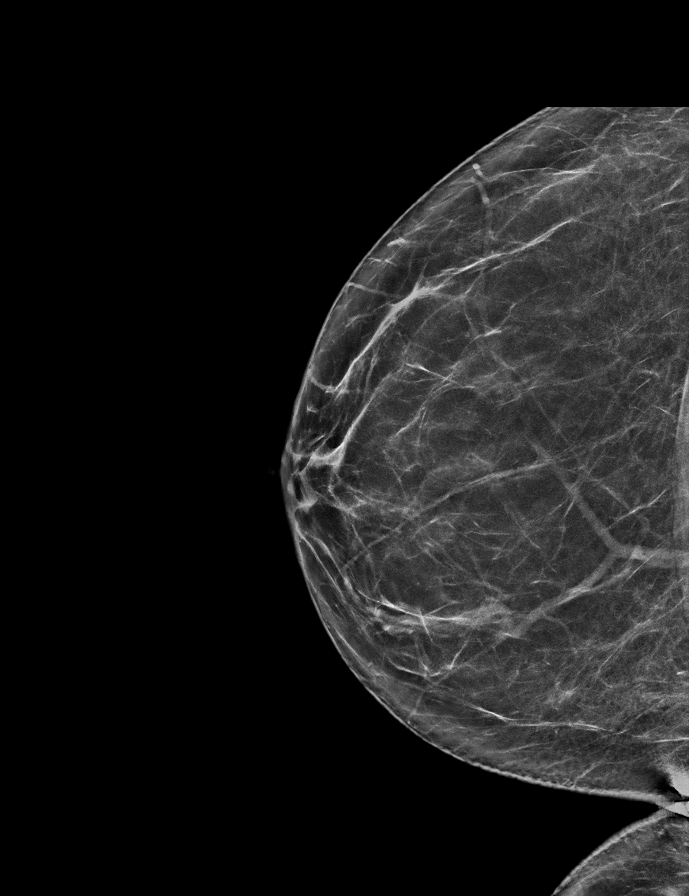

[L CC synth-2D]
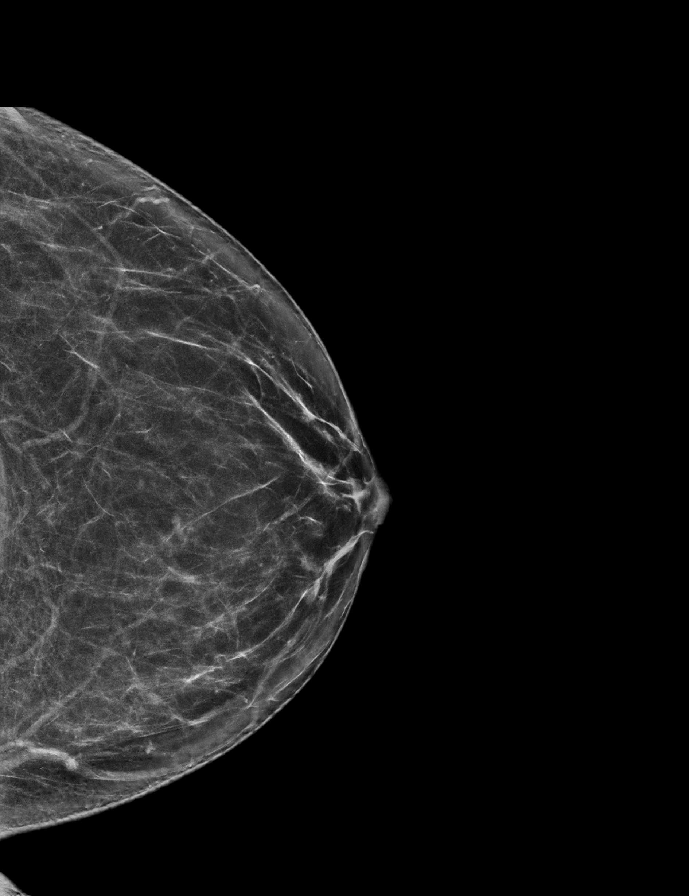

[L MLO synth-2D]
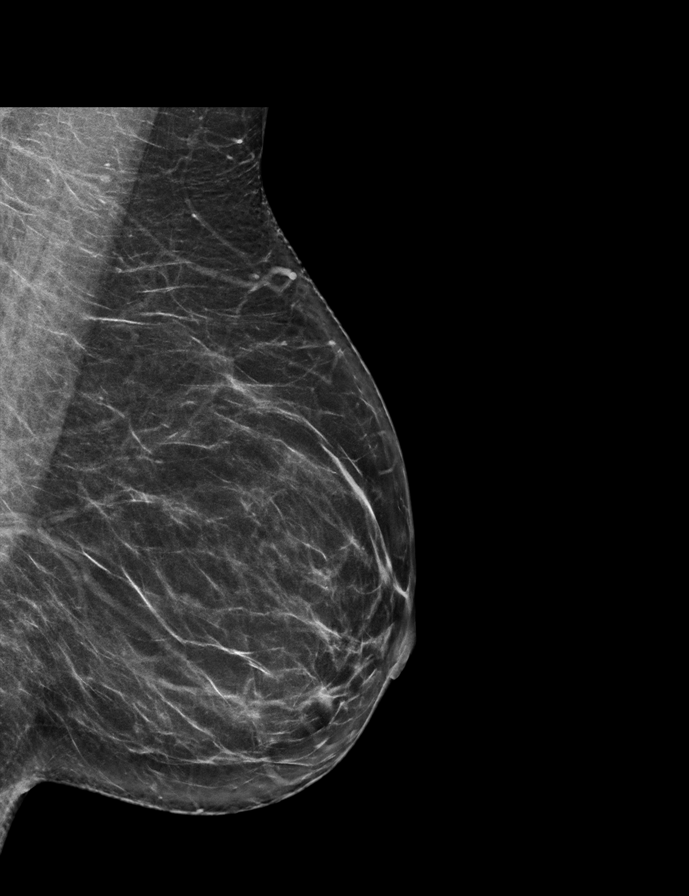

[R CC tomo · 2 of 64 frames shown]
[frame 21/64]
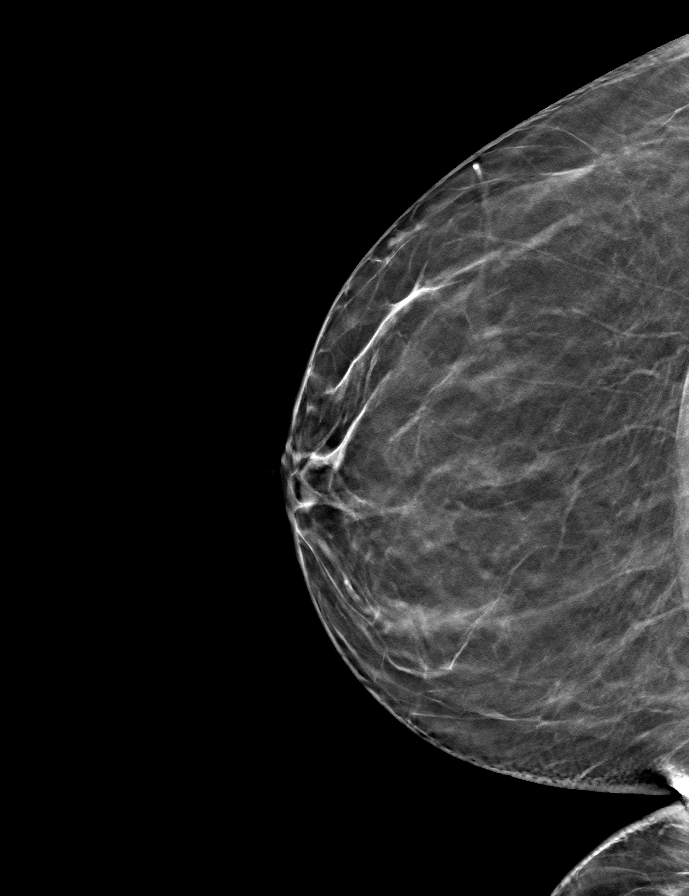
[frame 33/64]
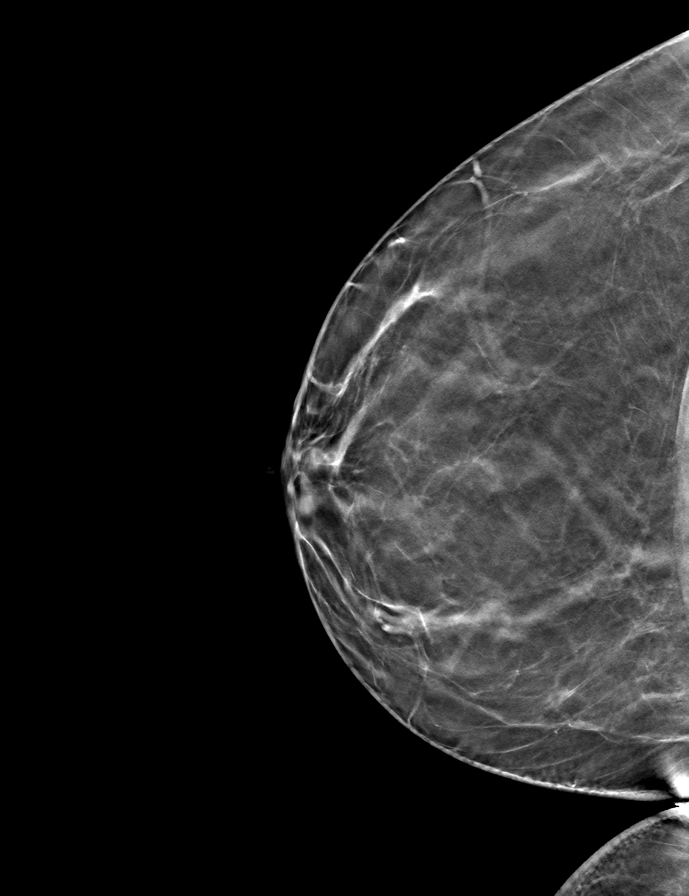

[L CC tomo · tomo slice 32/63.0]
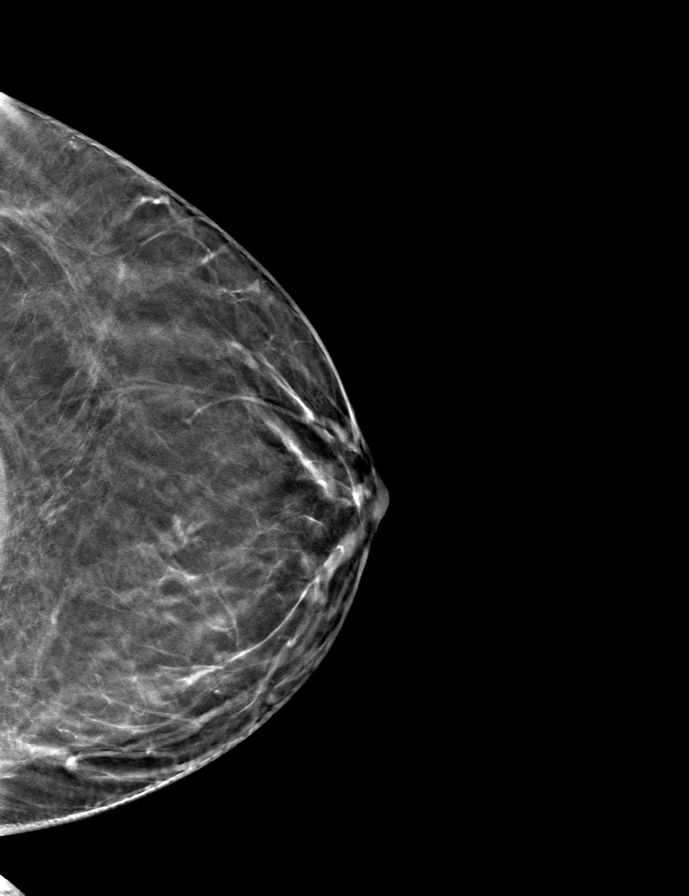

[L MLO tomo · tomo slice 33/66.0]
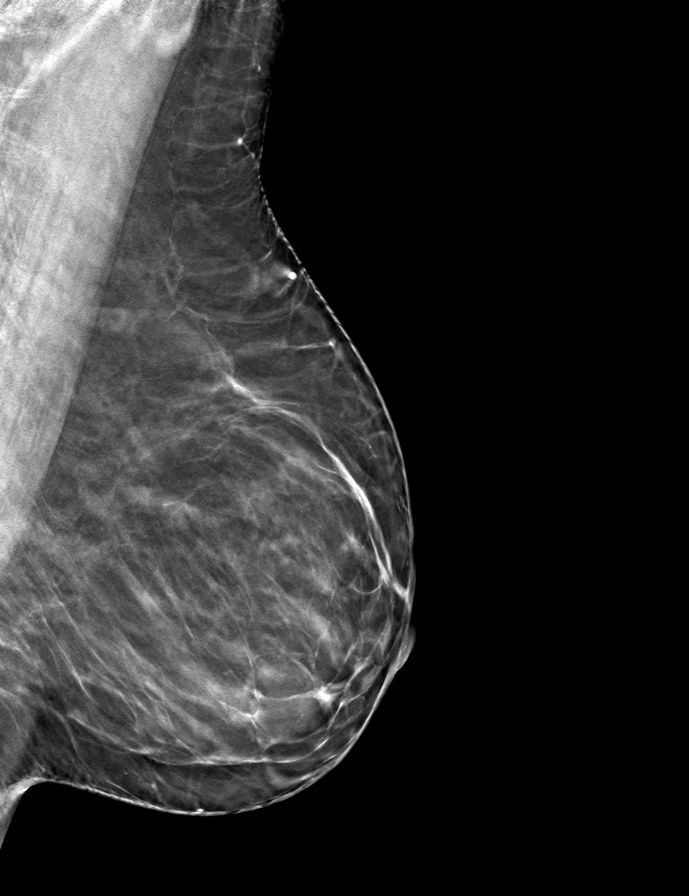

[R MLO tomo · tomo slice 36/71.0]
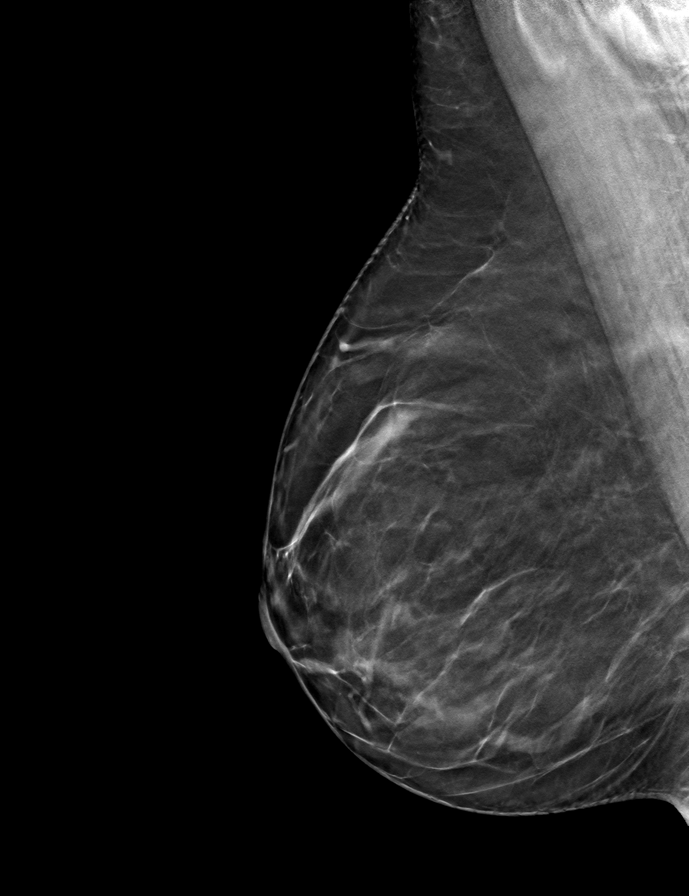

[9 of 24 positions shown; findings below may reference images not displayed]

ACR Breast Density Category b: There are scattered areas of
fibroglandular density.
FINDINGS: There are no findings suspicious for malignancy.
IMPRESSION: No mammographic evidence of malignancy. A result letter of this
screening mammogram will be mailed directly to the patient.

RECOMMENDATION:
Screening mammogram in one year. (Code:51-O-LD2)

BI-RADS CATEGORY  1: Negative.

## 2024-05-23 ENCOUNTER — Other Ambulatory Visit: Payer: Self-pay | Admitting: Internal Medicine

## 2024-05-23 NOTE — Progress Notes (Unsigned)
 Date:  05/23/2024   Name:  [name]   DOB:  1965/12/14   MRN:  [id number]   Chief Complaint: No chief complaint on file.  HPI  Review of Systems   Lab Results  Component Value Date   NA 141 05/23/2023   K 4.2 05/23/2023   CO2 27 05/23/2023   GLUCOSE 93 05/23/2023   BUN 16 05/23/2023   CREATININE 0.65 05/23/2023   CALCIUM 9.4 05/23/2023   EGFR 103 05/23/2023   GFRNONAA 102 05/11/2020   Lab Results  Component Value Date   CHOL 165 05/23/2023   HDL 70 05/23/2023   LDLCALC 86 05/23/2023   TRIG 38 05/23/2023   CHOLHDL 2.4 05/23/2023   Lab Results  Component Value Date   TSH 0.875 05/23/2023   Lab Results  Component Value Date   HGBA1C 5.4 05/23/2023   Lab Results  Component Value Date   WBC 5.1 05/23/2023   HGB 12.6 05/23/2023   HCT 39.0 05/23/2023   MCV 91 05/23/2023   PLT 223 05/23/2023   Lab Results  Component Value Date   ALT 17 05/23/2023   AST 20 05/23/2023   ALKPHOS 75 05/23/2023   BILITOT 0.3 05/23/2023   No results found for: MARIEN BOLLS, VD25OH   Patient Active Problem List   Diagnosis Date Noted   Personal history of colon polyps, unspecified 07/31/2023   Polyp of descending colon 07/31/2023   Elevated BP without diagnosis of hypertension 05/23/2023   Mild hyperlipidemia 05/18/2022   S/P TAH (total abdominal hysterectomy) 05/11/2020   Tubular adenoma of colon     Allergies[1]  Past Surgical History:  Procedure Laterality Date   COLONOSCOPY WITH PROPOFOL  N/A 06/29/2018   Procedure: COLONOSCOPY WITH PROPOFOL ;  Surgeon: Janalyn Keene NOVAK, MD;  Location: [facility] ENDOSCOPY;  Service: Endoscopy;  Laterality: N/A;   COLONOSCOPY WITH PROPOFOL  N/A 07/31/2023   Procedure: COLONOSCOPY WITH PROPOFOL ;  Surgeon: Unk Corinn Skiff, MD;  Location: [facility] ENDOSCOPY;  Service: Gastroenterology;  Laterality: N/A;   POLYPECTOMY  07/31/2023   Procedure: POLYPECTOMY;  Surgeon: Unk Corinn Skiff, MD;  Location: [facility] ENDOSCOPY;  Service:  Gastroenterology;;   TONSILLECTOMY     TOTAL ABDOMINAL HYSTERECTOMY  06/14/2011   Prolapse   TUBAL LIGATION      Social History[2]   Medication list has been reviewed and updated.  Active Medications[3]     05/23/2023    8:11 AM 09/23/2022   11:11 AM 05/19/2022    8:10 AM 05/17/2021    7:58 AM  GAD 7 : Generalized Anxiety Score  Nervous, Anxious, on Edge 0 0 0 0  Control/stop worrying 0 0 0 0  Worry too much - different things 0 0 0 0  Trouble relaxing 0 0 0 0  Restless 0 0 0 0  Easily annoyed or irritable 0 0 0 0  Afraid - awful might happen 0 0 0 0  Total GAD 7 Score 0 0 0 0  Anxiety Difficulty Not difficult at all Not difficult at all Not difficult at all Not difficult at all       05/23/2023    8:11 AM 09/23/2022   11:11 AM 05/19/2022    8:10 AM  Depression screen PHQ 2/9  Decreased Interest 0 0 0  Down, Depressed, Hopeless 0 0 0  PHQ - 2 Score 0 0 0  Altered sleeping 0 1 0  Tired, decreased energy 0 0 0  Change in appetite 0 0 0  Feeling bad or failure  about yourself  0 0 0  Trouble concentrating 0 0 0  Moving slowly or fidgety/restless 0 0 0  Suicidal thoughts 0 0 0  PHQ-9 Score 0  1  0   Difficult doing work/chores Not difficult at all Not difficult at all Not difficult at all     Data saved with a previous flowsheet row definition    BP Readings from Last 3 Encounters:  07/31/23 126/71  05/23/23 (!) 140/95  09/23/22 134/88    Physical Exam  Wt Readings from Last 3 Encounters:  10/04/23 161 lb 9.6 oz (73.3 kg)  07/31/23 161 lb 9.6 oz (73.3 kg)  05/23/23 165 lb (74.8 kg)    There were no vitals taken for this visit.  Assessment and Plan:  Problem List Items Addressed This Visit   None   No follow-ups on file.    Leita HILARIO Adie, MD [facility] [place]           [1]  Allergies Allergen Reactions   Penicillins   [2]  Social History Tobacco Use   Smoking status: Never   Smokeless tobacco:  Never  Substance Use Topics   Alcohol use: No    Alcohol/week: 0.0 standard drinks of alcohol   Drug use: Never  [3]  No outpatient medications have been marked as taking for the 05/23/24 encounter (Orders Only) with Adie Leita DEL, MD.

## 2024-05-24 ENCOUNTER — Ambulatory Visit: Payer: Self-pay | Admitting: Internal Medicine

## 2024-05-24 ENCOUNTER — Encounter: Payer: Self-pay | Admitting: Internal Medicine

## 2024-05-24 VITALS — BP 128/66 | HR 72 | Ht 63.0 in | Wt 161.0 lb

## 2024-05-24 DIAGNOSIS — R030 Elevated blood-pressure reading, without diagnosis of hypertension: Secondary | ICD-10-CM | POA: Diagnosis not present

## 2024-05-24 DIAGNOSIS — E785 Hyperlipidemia, unspecified: Secondary | ICD-10-CM | POA: Diagnosis not present

## 2024-05-24 DIAGNOSIS — Z131 Encounter for screening for diabetes mellitus: Secondary | ICD-10-CM

## 2024-05-24 DIAGNOSIS — Z1231 Encounter for screening mammogram for malignant neoplasm of breast: Secondary | ICD-10-CM

## 2024-05-24 DIAGNOSIS — Z0000 Encounter for general adult medical examination without abnormal findings: Secondary | ICD-10-CM | POA: Diagnosis not present

## 2024-05-24 NOTE — Assessment & Plan Note (Signed)
 Limiting lipids with diet.

## 2024-05-24 NOTE — Patient Instructions (Signed)
 Call [facility] to schedule your mammogram at [phone].

## 2024-05-24 NOTE — Progress Notes (Signed)
 Date:  05/24/2024   Name:  [name]   DOB:  Aug 31, 1965   MRN:  [id number]   Chief Complaint: Annual Exam [name] is a 58 y.o. female who presents today for her Complete Annual Exam. She feels well. She reports exercising. She reports she is sleeping well. Breast complaints - none.  Health Maintenance  Topic Date Due   Hepatitis B Vaccine (1 of 3 - 19+ 3-dose series) Never done   Pneumococcal Vaccine for age over 5 (1 of 1 - PCV) Never done   DTaP/Tdap/Td vaccine (3 - Td or Tdap) 11/26/2019   COVID-19 Vaccine (4 - 2025-26 season) 02/12/2024   Breast Cancer Screening  08/27/2024   Colon Cancer Screening  07/30/2030   Flu Shot  Completed   Hepatitis C Screening  Completed   Zoster (Shingles) Vaccine  Completed   HIV Screening  Addressed   HPV Vaccine  Aged Out   Meningitis B Vaccine  Aged Out    HPI  Review of Systems  Constitutional:  Negative for fatigue and unexpected weight change.  HENT:  Negative for trouble swallowing.   Eyes:  Negative for visual disturbance.  Respiratory:  Negative for cough, chest tightness, shortness of breath and wheezing.   Cardiovascular:  Negative for chest pain, palpitations and leg swelling.  Gastrointestinal:  Negative for abdominal pain, constipation and diarrhea.  Genitourinary:  Negative for dysuria and hematuria.  Musculoskeletal:  Negative for arthralgias and myalgias.  Neurological:  Negative for dizziness, weakness, light-headedness and headaches.  Psychiatric/Behavioral:  Negative for dysphoric mood and sleep disturbance. The patient is not nervous/anxious.      Lab Results  Component Value Date   NA 141 05/23/2023   K 4.2 05/23/2023   CO2 27 05/23/2023   GLUCOSE 93 05/23/2023   BUN 16 05/23/2023   CREATININE 0.65 05/23/2023   CALCIUM 9.4 05/23/2023   EGFR 103 05/23/2023   GFRNONAA 102 05/11/2020   Lab Results  Component Value Date   CHOL 165 05/23/2023   HDL 70 05/23/2023   LDLCALC 86 05/23/2023   TRIG 38  05/23/2023   CHOLHDL 2.4 05/23/2023   Lab Results  Component Value Date   TSH 0.875 05/23/2023   Lab Results  Component Value Date   HGBA1C 5.4 05/23/2023   Lab Results  Component Value Date   WBC 5.1 05/23/2023   HGB 12.6 05/23/2023   HCT 39.0 05/23/2023   MCV 91 05/23/2023   PLT 223 05/23/2023   Lab Results  Component Value Date   ALT 17 05/23/2023   AST 20 05/23/2023   ALKPHOS 75 05/23/2023   BILITOT 0.3 05/23/2023   No results found for: MARIEN BOLLS, VD25OH   Patient Active Problem List   Diagnosis Date Noted   Polyp of descending colon 07/31/2023   Elevated BP without diagnosis of hypertension 05/23/2023   Mild hyperlipidemia 05/18/2022   S/P TAH (total abdominal hysterectomy) 05/11/2020   Tubular adenoma of colon     Allergies[1]  Past Surgical History:  Procedure Laterality Date   COLONOSCOPY WITH PROPOFOL  N/A 06/29/2018   Procedure: COLONOSCOPY WITH PROPOFOL ;  Surgeon: Janalyn Keene NOVAK, MD;  Location: [facility] ENDOSCOPY;  Service: Endoscopy;  Laterality: N/A;   COLONOSCOPY WITH PROPOFOL  N/A 07/31/2023   Procedure: COLONOSCOPY WITH PROPOFOL ;  Surgeon: Unk Corinn Skiff, MD;  Location[facility] ENDOSCOPY;  Service: Gastroenterology;  Laterality: N/A;   POLYPECTOMY  07/31/2023   Procedure: POLYPECTOMY;  Surgeon: Unk Corinn Skiff, MD;  Location: [facility] ENDOSCOPY;  Service: Gastroenterology;;   TONSILLECTOMY     TOTAL ABDOMINAL HYSTERECTOMY  06/14/2011   Prolapse   TUBAL LIGATION      Social History[2]   Medication list has been reviewed and updated.  Active Medications[3]     05/24/2024    7:56 AM 05/23/2023    8:11 AM 09/23/2022   11:11 AM 05/19/2022    8:10 AM  GAD 7 : Generalized Anxiety Score  Nervous, Anxious, on Edge 0 0 0 0  Control/stop worrying 0 0 0 0  Worry too much - different things 0 0 0 0  Trouble relaxing 0 0 0 0  Restless 0 0 0 0  Easily annoyed or irritable 0 0 0 0  Afraid - awful might happen 0 0 0 0  Total GAD  7 Score 0 0 0 0  Anxiety Difficulty Not difficult at all Not difficult at all Not difficult at all Not difficult at all       05/24/2024    7:56 AM 05/23/2023    8:11 AM 09/23/2022   11:11 AM  Depression screen PHQ 2/9  Decreased Interest 0 0 0  Down, Depressed, Hopeless 0 0 0  PHQ - 2 Score 0 0 0  Altered sleeping 0 0 1  Tired, decreased energy 0 0 0  Change in appetite 0 0 0  Feeling bad or failure about yourself  0 0 0  Trouble concentrating 0 0 0  Moving slowly or fidgety/restless 0 0 0  Suicidal thoughts 0 0 0  PHQ-9 Score 0 0  1   Difficult doing work/chores Not difficult at all Not difficult at all Not difficult at all     Data saved with a previous flowsheet row definition    BP Readings from Last 3 Encounters:  05/24/24 128/66  07/31/23 126/71  05/23/23 (!) 140/95    Physical Exam Vitals and nursing note reviewed.  Constitutional:      General: She is not in acute distress.    Appearance: She is well-developed.  HENT:     Head: Normocephalic and atraumatic.     Right Ear: Tympanic membrane and ear canal normal.     Left Ear: Tympanic membrane and ear canal normal.     Nose:     Right Sinus: No maxillary sinus tenderness.     Left Sinus: No maxillary sinus tenderness.  Eyes:     General: No scleral icterus.       Right eye: No discharge.        Left eye: No discharge.     Conjunctiva/sclera: Conjunctivae normal.  Neck:     Thyroid: No thyromegaly.     Vascular: No carotid bruit.  Cardiovascular:     Rate and Rhythm: Normal rate and regular rhythm.     Pulses: Normal pulses.     Heart sounds: Normal heart sounds.  Pulmonary:     Effort: Pulmonary effort is normal. No respiratory distress.     Breath sounds: No wheezing.  Abdominal:     General: Bowel sounds are normal.     Palpations: Abdomen is soft.     Tenderness: There is no abdominal tenderness.  Musculoskeletal:     Cervical back: Normal range of motion. No erythema.     Right lower leg: No  edema.     Left lower leg: No edema.  Lymphadenopathy:     Cervical: No cervical adenopathy.  Skin:    General: Skin is warm and dry.  Findings: No rash.  Neurological:     Mental Status: She is alert and oriented to person, place, and time.     Cranial Nerves: No cranial nerve deficit.     Sensory: No sensory deficit.     Deep Tendon Reflexes: Reflexes are normal and symmetric.  Psychiatric:        Attention and Perception: Attention normal.        Mood and Affect: Mood normal.     Wt Readings from Last 3 Encounters:  05/24/24 161 lb (73 kg)  10/04/23 161 lb 9.6 oz (73.3 kg)  07/31/23 161 lb 9.6 oz (73.3 kg)    BP 128/66   Pulse 72   Ht 5' 3 (1.6 m)   Wt 161 lb (73 kg)   SpO2 100%   BMI 28.52 kg/m   Assessment and Plan:  Problem List Items Addressed This Visit       Unprioritized   Mild hyperlipidemia (Chronic)   Limiting lipids with diet.       Relevant Orders   Lipid panel   Elevated BP without diagnosis of hypertension   BP controlled today. She has been checking at home intermittently. About half the readings are mildly elevated. Will continue low sodium diet and exercise.      Relevant Orders   CBC with Differential/Platelet   Comprehensive metabolic panel with GFR   TSH   Urinalysis, Routine w reflex microscopic   Other Visit Diagnoses       Annual physical exam    -  Primary   continue healthy diet, exercise monitor BP regularly consider Prevnar-20   Relevant Orders   CBC with Differential/Platelet   Comprehensive metabolic panel with GFR   Hemoglobin A1c   Lipid panel   TSH   Urinalysis, Routine w reflex microscopic     Encounter for screening mammogram for breast cancer       Relevant Orders   MM 3D SCREENING MAMMOGRAM BILATERAL BREAST     Screening for diabetes mellitus       Relevant Orders   Hemoglobin A1c       No follow-ups on file.    Leita HILARIO Adie, MD [facility]  [place]           [1]  Allergies Allergen Reactions   Penicillins   [2]  Social History Tobacco Use   Smoking status: Never   Smokeless tobacco: Never  Substance Use Topics   Alcohol use: No    Alcohol/week: 0.0 standard drinks of alcohol   Drug use: Never  [3]  Current Meds  Medication Sig   cetirizine (ZYRTEC) 10 MG tablet Take 10 mg by mouth daily.   ibuprofen (ADVIL) 800 MG tablet Take 800 mg by mouth every 8 (eight) hours as needed.   meloxicam  (MOBIC ) 15 MG tablet Take 15 mg by mouth daily.

## 2024-05-24 NOTE — Assessment & Plan Note (Signed)
 BP controlled today. She has been checking at home intermittently. About half the readings are mildly elevated. Will continue low sodium diet and exercise.

## 2024-05-25 LAB — URINALYSIS, ROUTINE W REFLEX MICROSCOPIC
Bilirubin, UA: NEGATIVE
Glucose, UA: NEGATIVE
Ketones, UA: NEGATIVE
Leukocytes,UA: NEGATIVE
Nitrite, UA: NEGATIVE
Protein,UA: NEGATIVE
RBC, UA: NEGATIVE
Specific Gravity, UA: 1.02 (ref 1.005–1.030)
Urobilinogen, Ur: 1 mg/dL (ref 0.2–1.0)
pH, UA: 7 (ref 5.0–7.5)

## 2024-05-25 LAB — COMPREHENSIVE METABOLIC PANEL WITH GFR
ALT: 18 IU/L (ref 0–32)
AST: 23 IU/L (ref 0–40)
Albumin: 4.3 g/dL (ref 3.8–4.9)
Alkaline Phosphatase: 61 IU/L (ref 49–135)
BUN/Creatinine Ratio: 26 — ABNORMAL HIGH (ref 9–23)
BUN: 17 mg/dL (ref 6–24)
Bilirubin Total: 0.4 mg/dL (ref 0.0–1.2)
CO2: 24 mmol/L (ref 20–29)
Calcium: 9.3 mg/dL (ref 8.7–10.2)
Chloride: 103 mmol/L (ref 96–106)
Creatinine, Ser: 0.65 mg/dL (ref 0.57–1.00)
Globulin, Total: 2.5 g/dL (ref 1.5–4.5)
Glucose: 93 mg/dL (ref 70–99)
Potassium: 4.1 mmol/L (ref 3.5–5.2)
Sodium: 141 mmol/L (ref 134–144)
Total Protein: 6.8 g/dL (ref 6.0–8.5)
eGFR: 103 mL/min/1.73 (ref >59)

## 2024-05-25 LAB — CBC WITH DIFFERENTIAL/PLATELET
Basophils Absolute: 0 x10E3/uL (ref 0.0–0.2)
Basos: 1 %
EOS (ABSOLUTE): 0.2 x10E3/uL (ref 0.0–0.4)
Eos: 5 %
Hematocrit: 37.8 % (ref 34.0–46.6)
Hemoglobin: 12.1 g/dL (ref 11.1–15.9)
Immature Grans (Abs): 0 x10E3/uL (ref 0.0–0.1)
Immature Granulocytes: 0 %
Lymphocytes Absolute: 1.6 x10E3/uL (ref 0.7–3.1)
Lymphs: 38 %
MCH: 30.2 pg (ref 26.6–33.0)
MCHC: 32 g/dL (ref 31.5–35.7)
MCV: 94 fL (ref 79–97)
Monocytes Absolute: 0.4 x10E3/uL (ref 0.1–0.9)
Monocytes: 9 %
Neutrophils Absolute: 1.9 x10E3/uL (ref 1.4–7.0)
Neutrophils: 47 %
Platelets: 198 x10E3/uL (ref 150–450)
RBC: 4.01 x10E6/uL (ref 3.77–5.28)
RDW: 12.2 % (ref 11.7–15.4)
WBC: 4.1 x10E3/uL (ref 3.4–10.8)

## 2024-05-25 LAB — LIPID PANEL
Chol/HDL Ratio: 2.2 ratio (ref 0.0–4.4)
Cholesterol, Total: 160 mg/dL (ref 100–199)
HDL: 72 mg/dL (ref >39)
LDL Chol Calc (NIH): 80 mg/dL (ref 0–99)
Triglycerides: 33 mg/dL (ref 0–149)
VLDL Cholesterol Cal: 8 mg/dL (ref 5–40)

## 2024-05-25 LAB — HEMOGLOBIN A1C
Est. average glucose Bld gHb Est-mCnc: 97 mg/dL
Hgb A1c MFr Bld: 5 % (ref 4.8–5.6)

## 2024-05-25 LAB — TSH: TSH: 0.856 u[IU]/mL (ref 0.450–4.500)

## 2024-05-26 ENCOUNTER — Ambulatory Visit: Payer: Self-pay | Admitting: Internal Medicine

## 2025-05-28 ENCOUNTER — Encounter: Admitting: Student
